# Patient Record
Sex: Male | Born: 1960 | Race: White | Hispanic: No | Marital: Married | Smoking: Never smoker
Health system: Southern US, Community
[De-identification: ages and names within clinical notes are randomized; demographics above are authoritative.]

## PROBLEM LIST (undated history)

## (undated) DIAGNOSIS — J302 Other seasonal allergic rhinitis: Secondary | ICD-10-CM

## (undated) HISTORY — PX: OTHER SURGICAL HISTORY: SHX169

## (undated) HISTORY — PX: EYE SURGERY: SHX253

---

## 1997-01-03 HISTORY — PX: CORNEAL TRANSPLANT: SHX108

## 2001-08-31 ENCOUNTER — Inpatient Hospital Stay (HOSPITAL_COMMUNITY): Admission: EM | Admit: 2001-08-31 | Discharge: 2001-09-02 | Payer: Self-pay | Admitting: Internal Medicine

## 2001-09-01 ENCOUNTER — Encounter: Payer: Self-pay | Admitting: Internal Medicine

## 2005-02-09 ENCOUNTER — Emergency Department (HOSPITAL_COMMUNITY): Admission: EM | Admit: 2005-02-09 | Discharge: 2005-02-10 | Payer: Self-pay | Admitting: Family Medicine

## 2007-07-03 ENCOUNTER — Encounter (INDEPENDENT_AMBULATORY_CARE_PROVIDER_SITE_OTHER): Payer: Self-pay | Admitting: Gastroenterology

## 2007-07-03 ENCOUNTER — Inpatient Hospital Stay (HOSPITAL_COMMUNITY): Admission: EM | Admit: 2007-07-03 | Discharge: 2007-07-04 | Payer: Self-pay | Admitting: Emergency Medicine

## 2007-07-05 ENCOUNTER — Inpatient Hospital Stay (HOSPITAL_COMMUNITY): Admission: EM | Admit: 2007-07-05 | Discharge: 2007-07-11 | Payer: Self-pay | Admitting: Emergency Medicine

## 2007-08-03 ENCOUNTER — Emergency Department (HOSPITAL_COMMUNITY): Admission: EM | Admit: 2007-08-03 | Discharge: 2007-08-03 | Payer: Self-pay | Admitting: Family Medicine

## 2008-03-07 ENCOUNTER — Emergency Department (HOSPITAL_COMMUNITY): Admission: EM | Admit: 2008-03-07 | Discharge: 2008-03-07 | Payer: Self-pay | Admitting: Emergency Medicine

## 2009-04-22 ENCOUNTER — Ambulatory Visit (HOSPITAL_COMMUNITY): Admission: RE | Admit: 2009-04-22 | Discharge: 2009-04-22 | Payer: Self-pay | Admitting: Gastroenterology

## 2009-11-30 ENCOUNTER — Emergency Department (HOSPITAL_COMMUNITY): Admission: EM | Admit: 2009-11-30 | Discharge: 2009-11-30 | Payer: Self-pay | Admitting: Family Medicine

## 2009-11-30 ENCOUNTER — Emergency Department (HOSPITAL_COMMUNITY)
Admission: EM | Admit: 2009-11-30 | Discharge: 2009-11-30 | Payer: Self-pay | Source: Home / Self Care | Admitting: Emergency Medicine

## 2009-12-04 ENCOUNTER — Ambulatory Visit (HOSPITAL_COMMUNITY): Admission: RE | Admit: 2009-12-04 | Discharge: 2009-12-04 | Payer: Self-pay | Admitting: Urology

## 2009-12-04 ENCOUNTER — Ambulatory Visit (HOSPITAL_BASED_OUTPATIENT_CLINIC_OR_DEPARTMENT_OTHER)
Admission: RE | Admit: 2009-12-04 | Discharge: 2009-12-04 | Payer: Self-pay | Source: Home / Self Care | Admitting: Urology

## 2009-12-10 ENCOUNTER — Ambulatory Visit (HOSPITAL_COMMUNITY)
Admission: RE | Admit: 2009-12-10 | Discharge: 2009-12-10 | Payer: Self-pay | Source: Home / Self Care | Attending: Urology | Admitting: Urology

## 2010-01-21 ENCOUNTER — Ambulatory Visit (HOSPITAL_COMMUNITY)
Admission: RE | Admit: 2010-01-21 | Discharge: 2010-01-21 | Payer: Self-pay | Source: Home / Self Care | Attending: Urology | Admitting: Urology

## 2010-03-15 LAB — POCT HEMOGLOBIN-HEMACUE: Hemoglobin: 16.4 g/dL (ref 13.0–17.0)

## 2010-03-16 LAB — URINALYSIS, ROUTINE W REFLEX MICROSCOPIC
Bilirubin Urine: NEGATIVE
Glucose, UA: NEGATIVE mg/dL
Ketones, ur: 15 mg/dL — AB
Leukocytes, UA: NEGATIVE
Nitrite: NEGATIVE
Protein, ur: NEGATIVE mg/dL
Specific Gravity, Urine: 1.014 (ref 1.005–1.030)
Urobilinogen, UA: 0.2 mg/dL (ref 0.0–1.0)
pH: 7 (ref 5.0–8.0)

## 2010-03-16 LAB — URINE MICROSCOPIC-ADD ON

## 2010-05-18 NOTE — Discharge Summary (Signed)
NAME:  Patrick Myers, Patrick Myers                ACCOUNT NO.:  [id number]   MEDICAL RECORD NO.:  [id number]          PATIENT TYPE:  INP   LOCATION:  1538                         FACILITY:  [facility]   PHYSICIAN:  John C. Madilyn Fireman, M.D.    DATE OF BIRTH:  30-Jan-1960   DATE OF ADMISSION:  07/05/2007  DATE OF DISCHARGE:  07/11/2007                               DISCHARGE SUMMARY   HISTORY OF ILLNESS:  The patient is a 50 year old white male who was  readmitted after initial hospitalization on July 03, 2007, with GI  bleeding.  He was found have a small duodenal ulcer at that time with no  significant stigma of hemorrhage at that point.  After a 2-day  admission, he was readmitted the following day with continued dark  stools and increased weakness.  For details, please see admission  history and physical.   COURSE IN THE HOSPITAL:  The patient was readmitted and started back on  IV Protonix.  On 07/06/2007, he underwent EGD with a significant amount  of blood seen in the stomach and an oozing duodenal bulb ulcer.  It was  injected and two clips were placed.  He did fairly well from that point.  He did require I believe 2 units of packed red blood cells during his  admission.  After June 08, 2007, the hemoglobin remained stable in the  range of 9.3 to 10.5, and on discharge it was 9.5.  His diet was  gradually advanced and his BUN was 8 on the day prior to discharge.  He  had, had a positive H. pylori antibody test on his previous admission.  It was decided to wait until his follow-up appointment with Dr. Laural Benes  to treat for H. pylori.  He is discharged on Prilosec OTC 20 mg b.i.d.,  a bland diet and instructions not to take any nonsteroidal anti-  inflammatory drugs.   DISCHARGE DIAGNOSIS:  Duodenal ulcer with hemorrhage.   CONDITION ON DISCHARGE:  Improved.           ______________________________  Everardo All Madilyn Fireman, M.D.     JCH/MEDQ  D:  07/11/2007  T:  07/11/2007  Job:  [id number]   cc:   Danise Edge, M.D.  Fax: [phone]   Anna Genre. Little, M.D.  Fax: [phone]

## 2010-05-18 NOTE — Discharge Summary (Signed)
NAME:  Patrick Myers, Patrick Myers NO.:  [id number]   MEDICAL RECORD NO.:  [id number]          PATIENT TYPE:  INP   LOCATION:  5504                         FACILITY:  [facility]   PHYSICIAN:  Danise Edge, M.D.   DATE OF BIRTH:  [birth year]   DATE OF ADMISSION:  07/03/2007  DATE OF DISCHARGE:  07/04/2007                               DISCHARGE SUMMARY   DISCHARGE DIAGNOSIS:  Resolved upper gastrointestinal bleeding due to an  acute duodenal ulcer.   DISCHARGE MEDICATIONS:  Omeprazole 20 mg daily for 30 days.   LABORATORY DATA:  Pending at discharge.  Gastric biopsy to rule out H.  pylori gastritis.   HOSPITAL COURSE:  [name] is a 50 year old male, born on  1960-10-30.  [name] was hospitalized approximately 25 years ago  in [place] for a bleeding and peptic ulcer.   [name] was admitted to [facility] through the emergency  room on July 03, 2007 to evaluate and treat upper gastrointestinal  bleeding manifested by the passage of dark stool, the evening prior to  admission and maroon stool, the morning of admission associated with  lightheadedness, dyspnea, and faintness.   [name] admission complete metabolic profile was normal.  His  prothrombin time was normal.  His admission hemoglobin was 12 g with a  normal white blood cell count and platelet count.   [name] underwent a diagnostic esophagogastroduodenoscopy at the day  of admission, which revealed a normal hypopharynx, esophagus, and  stomach.  At the junction of the distal duodenal bulb and proximal  second portion of duodenum was a 3-mm x 3-mm duodenal ulcer at low risk  for rebleeding.  Gastric biopsies were performed to rule out H. pylori.   [name] hemoglobin equilibrated at 9.8 g and he has had no further  gastrointestinal bleeding.   [name] is discharged in stable medical condition.  I will heal his  ulcer with omeprazole 20 mg daily for 30 days.  If his  gastric biopsies  are positive for H. pylori, he will receive antibiotic therapy for 2  weeks at the end of his ulcer healing therapy.   I have asked [name] to see me in the office in about 2 weeks.           ______________________________  Danise Edge, M.D.     MJ/MEDQ  D:  07/04/2007  T:  07/04/2007  Job:  [id number]   cc:   Caryn Bee L. Little, M.D.

## 2010-05-18 NOTE — Op Note (Signed)
NAME:  Patrick Myers, Patrick Myers NO.:  [id number]   MEDICAL RECORD NO.:  [id number]          PATIENT TYPE:  INP   LOCATION:  1225                         FACILITY:  [facility]   PHYSICIAN:  Petra Kuba, M.D.    DATE OF BIRTH:  1960-07-31   DATE OF PROCEDURE:  07/06/2007  DATE OF DISCHARGE:                               OPERATIVE REPORT   PROCEDURE:  EGD with therapy.   INDICATIONS:  Upper GI bleeding.  Consent was signed after risks,  benefits, methods, options were thoroughly discussed by both Dr. Danise Edge and me briefly with the wife.   MEDICINES USED:  Versed  5 mg only.   PROCEDURE:  The video endoscope was inserted by direct vision.  There  was a little old blood in his esophagus which could easily be suctioned.  He did have a small hiatal hernia with a widely patent ring, but no  signs of bleeding in the esophagus or obvious Mallory-Weiss tear.  Scope  passed into the stomach.  Lots of old blood was seen.  We advanced to  the antrum.  There seemed to be blood coming retrograde back from the  duodenum.  We advanced into the duodenal bulb, and the ulcer was in the  same position that Dr. Laural Benes had said.  It did have a fresh clot on  it, with some oozing.  Washing the clot away, there was some obvious  oozing from this location.  It was right at the juncture of the bulb and  the C-loop and was actually small and shallow as Dr. Laural Benes had  described.  We went ahead and injected epinephrine multiple times, 1-1.5  mL per injection, until we had good cessation of the bleeding, probably  proceeded with 8 mL at this juncture.  We then went ahead and tried to  clip the area where the bleeding was from.  Two clips were placed, but  we had some difficulty getting perfect position.  There seemed to still  be a residual visible vessel, and we went ahead and injected two more  times at this juncture, and then proceeded with Bi-Cap.  We did the Bi-  Cap at a setting of 220,  which caused some increased oozing.  We  increased it to 225.  Multiple pulses were delivered in the customary  fashion, with good cessation of the bleeding.  There was some oozing at  the edge of the ulcer, probably from the [facility] and not from the usual  site, and we went ahead and injected a little bit of epinephrine into  those sites as well.  The area was then washed and watched for a  prolonged period of time.  There was no further bleeding.  Multiple  pictures were obtained.  The scope periodically was withdrawn back into  the stomach, and water and old blood were suctioned.  At the end of the  procedure, there was nothing left in the stomach.  The stomach was  evaluated on straight and retroflexed visualization.  No additional  lesions or at-risk lesions or signs of bleeding  were seen.  We did  reinsert the scope multiple times before the end of the procedure back  into the duodenal bulb and confirmed good cessation of the bleeding.  The ulcer was washed multiple times at this juncture.  Again, air and  water were suctioned.  On slow withdrawal through the esophagus, no  additional findings were seen.  We did reinsert the scope one more time  into the bulb, which again confirmed good cessation of the bleeding at  the end of the procedure.  Air and water were suctioned.  Scope removed.  The patient tolerated the procedure adequately.  There was no obvious  immediate complication.   ENDOSCOPIC DIAGNOSES:  1. Small hiatal hernia, with a widely patent small fibrous ring.  2. Increased stomach with blood, but all suctioned by the end of the      procedure, and normal evaluation at the end of the procedure,      without any other at-risk lesions.  3. Oozing duodenal ulcer after clot was washed off, with some oozing      even with the clot in place, status post a total of epinephrine      injection to 18 mL, possibly 10-12 injections 3-4 different times      passing the catheter,  status post 2 clips placed, and Bi-Cap done      with the gold probe, 7 mm, first at 220 setting, and then at 225,      with good cessation of bleeding at the end of the procedure.  4. Otherwise normal esophagogastroduodenoscopy, with a good look at      the second and third part of the duodenum as well, not mentioned      above.   PLAN:  See how he does.  Consider reendoscopy versus surgery.  Continue  Protonix drip for now.  Transfuse p.r.n.  Follow labs, etc.  No aspirin  or nonsteroidals long term.  Treat Helicobacter pylori as an outpatient  per Dr.  Laural Benes.           ______________________________  Petra Kuba, M.D.     MEM/MEDQ  D:  07/06/2007  T:  07/06/2007  Job:  [id number]   cc:   Danise Edge, M.D.  Fax: [phone]

## 2010-05-18 NOTE — H&P (Signed)
NAME:  Patrick Myers, Patrick Myers NO.:  [id number]   MEDICAL RECORD NO.:  [id number]          PATIENT TYPE:  INP   LOCATION:  0101                         FACILITY:  [facility]   PHYSICIAN:  Rylend L. Malon Kindle., M.D.DATE OF BIRTH:  August 11, 1960   DATE OF ADMISSION:  07/05/2007  DATE OF DISCHARGE:                              HISTORY & PHYSICAL   REASON FOR ADMISSION:  Upper GI bleed.   HISTORY:  Patient is a nice 50 year old gentleman who was admitted to  the hospital by Dr. Danise Edge on June 30th.  He underwent an  endoscopy at that time for GI bleeding with the passage of dark stools  and marroonish stool. He was __________.  The endoscopy revealed normal  stomach with no evidence of blood in the stomach.  This was biopsied for  H. pylori.  I do not currently have that available.  There were no  varices seen.  This was all noted to be normal in the esophagus and GE  junction.  The duodenal bulb revealed inflammation and a 3-4 mm shallow  ulcer that was exudative with plaque, was seen right at the junction of  the duodenal bulb and distal portion of the duodenum.  It was not  actively bleeding, and visible vessels were not noted.  The patient's  wife brought pictures that Dr. Laural Benes had given them.  It did appear to  be a very shallow ulcer.  The patient continued to have darkish stools,  but it was felt this was equilibration.  He was discharged home  yesterday with an unstable hemoglobin of 9.8.  He notes that yesterday  he felt fine, although he continued to have dark stools, he felt fine  most of the day, then began to have an increasing number of stools.  He  has had a total of five melenic stools since coming out of the hospital  yesterday afternoon.  Called this afternoon and came to the emergency  room.  He vomited up some coffee-ground material in the emergency room,  a small amount.  He does feel better.  An IV was started.  He has been  stable.  He has had fairly  stable vital signs since coming to the  emergency room.   Currently, the patient is on omeprazole and Allegra.   He has no drug allergies.   He adamantly denies taking any nonsteroidals.   MEDICAL HISTORY:  He has a previous history of a bleeding ulcer and was  evaluated for this at some point in the past.  I do not have those  records.  He was admitted to [facility] in August, 2003 with an  acute hepatitis with all workup being negative for A, B, and C, which  __________  what was felt to be a virus, the exact etiology was not  determined.   The only surgeries have been __________.   SOCIAL HISTORY:  He does not smoke or drink.  He is married.  His wife  works __________.   FAMILY HISTORY:  Negative for colon cancer.   PHYSICAL EXAMINATION:  Patient is quite pale.  He is lying in a hospital  bed on the gurney.  VITAL SIGNS:  Afebrile.  Pulse 100, blood pressure 110/60.  There is no blood in his mouth.  NECK:  Supple.  LUNGS:  Clear.  HEART:  Regular rate and rhythm without murmurs or gallops.  ABDOMEN:  Soft and nontender.  RECTAL:  Not performed since he has continued to have melenic stools and  has vomited blood.   ASSESSMENT:  Upper gastrointestinal bleed, probably due to recurrent  bleeding ulcer.   PLAN:  Will admit.  Transfuse 2 units of blood since his hemoglobin has  dropped 2 gm from 9.9 to 7.5.  Will put him on a Protonix drip.  If he  is not stabilized, he may well need another endoscopy in the morning by  Dr. Ewing Schlein.  I have discussed this with the patient and his wife and  explained to them that we will keep him n.p.o. for now.           ______________________________  Llana Aliment. Malon Kindle., M.D.     Waldron Session  D:  07/05/2007  T:  07/05/2007  Job:  [id number]   cc:   Caryn Bee L. Little, M.D.  Fax: [phone]   Danise Edge, M.D.  Fax: [phone]

## 2010-05-18 NOTE — H&P (Signed)
NAME:  Patrick Myers, [place] NO.:  [id number]   MEDICAL RECORD NO.:  [id number]          PATIENT TYPE:  INP   LOCATION:  5504                         FACILITY:  [facility]   PHYSICIAN:  Danise Edge, M.D.   DATE OF BIRTH:  07-30-1960   DATE OF ADMISSION:  07/03/2007  DATE OF DISCHARGE:                              HISTORY & PHYSICAL   ADMISSION DIAGNOSIS:  Gastrointestinal bleeding.   HISTORY:  Mr. Patrick Myers is a 50 year old male born on March 15, 1960.  I admitted [name] through the [facility] Emergency Room to  evaluate and treat gastrointestinal bleeding manifested by the passage  of dark stool last night and maroon stool this morning associated with  lightheadedness, dyspnea, and a fainting sensation.   Approximately 25 years ago, Mr. Patrick Myers was hospitalized while living in  [place] to treat a bleeding peptic ulcer, which was diagnosed by upper  endoscopy.  He did not receive treatment for H. pylori.   Mr. Patrick Myers reports no abdominal pain, nonsteroidal anti-inflammatory  drug use, alcohol use, cigarette smoking, nausea, or vomiting.   MEDICATION ALLERGIES:  None.   CURRENT MEDICATIONS:  Allegra.   PAST MEDICAL HISTORY:  Cornea transplant; bleeding peptic ulcer; [facility] hospitalization, August 2003 to evaluate acute hepatitis which was  negative for hepatitis A, hepatitis B, and hepatitis C.  He completely  recovered from this bout of hepatitis.   FAMILY HISTORY:  Negative for colon cancer.   HABITS:  Mr. Patrick Myers does not smoke cigarettes or consume alcohol.   GENERAL APPEARANCE:  [name] is alert and comfortable, lying on his  stretcher.  HEENT:  Sclerae nonicteric.  Oropharynx normal.  LUNGS:  Clear to auscultation.  CARDIAC:  Regular rhythm without murmurs.  ABDOMEN:  Soft, flat, and nontender.   PROCEDURE:  DIAGNOSTIC ESOPHAGOGASTRODUODENOSCOPY:   After obtaining informed consent, Mr. Patrick Myers was placed in the left  lateral decubitus  position.  I administered 75 mcg of fentanyl and 5 mg  Versed to achieve conscious sedation for the procedure.  The patient's  blood pressure, oxygen saturation, and cardiac rhythm were monitored  throughout the procedure and documented in the medical record.   The Pentax gastroscope was passed through the posterior hypopharynx into  the proximal esophagus without difficulty.  The hypopharynx, larynx, and  vocal cords appeared normal.   ESOPHAGOSCOPY:  The proximal mid and lower segments of the esophageal  mucosa appeared completely normal.  The squamocolumnar junction and  esophagogastric junction were noted at approximately 42 cm from the  incisor teeth.   GASTROSCOPY:  Retroflex view of the gastric cardia and fundus was  normal.  The gastric body, antrum, and pylorus appeared completely  normal.   DUODENOSCOPY:  Examination of the duodenal bulb was normal.  In the  proximal second portion of the duodenum, hidden in a crevice, a 3-mm x 3-  mm ulcer was identified.  The ulcer base is exudative and flat.  The  distal second portion of the duodenum appeared normal.  The estimated  risk for re-bleeding from this lesion would be low.  BIOPSIES:  Biopsies were taken from the gastric antrum, gastric body,  and the gastric cardia to look for H. pylori gastritis.   ASSESSMENT:  Upper gastrointestinal bleeding due to a duodenal ulcer in  the proximal second portion of the duodenum.  Endoscopic appearance of  the ulcer identifies it as a lesion at low risk for re-bleeding.  Biopsies to rule out Helicobacter pylori are pending.   PLAN:  I will place Mr. Patrick Myers on a regular diet.  I will monitor him  overnight for re-bleeding.  I anticipate he should probably be  discharged tomorrow morning.  He will receive proton pump inhibitor  therapy for 4-6 weeks.  He should also receive eradication or antibiotic  therapy for H. pylori.           ______________________________  Danise Edge,  M.D.     MJ/MEDQ  D:  07/03/2007  T:  07/04/2007  Job:  [id number]   cc:   Caryn Bee L. Little, M.D.

## 2010-05-21 NOTE — Discharge Summary (Signed)
NAME:  Patrick Myers, Patrick Myers                          ACCOUNT NO.:  [id number]   MEDICAL RECORD NO.:  [id number]                   PATIENT TYPE:  INP   LOCATION:  0380                                 FACILITY:  [facility]   PHYSICIAN:  Deirdre Peer. Polite, M.D.              DATE OF BIRTH:  [birth year]   DATE OF ADMISSION:  08/31/2001  DATE OF DISCHARGE:  09/02/2001                                 DISCHARGE SUMMARY   DISCHARGE DIAGNOSES:  1. Viral hepatitis.  2. History of peptic ulcer disease status post gastrointestinal bleed in     1987.  3. Status post corneal transplant.   DISCHARGE MEDICATION:  Phenergan 12.5 mg one q.6h. p.r.n. for nausea or  vomiting.   CONSULTATIONS:  None.   PROCEDURES/STUDIES:  None.   LABORATORY DATA:  Admission CMP with albumin of 3.3, AST 1844, ALT 2658.  CBC with white blood cell count of 3.7, rbc 5.25, hemoglobin 15.6,  hematocrit 45.3, platelet count 143.  Alkaline phosphatase 193, bilirubin  1.5.  PT 13, INR 1.  Hepatitis B negative, hepatitis C antibody negative.  Mono screen was negative.  Urine was negative.   Discharge labs with SGOT of 787, SGPT 1748.  PT 12.2, INR 0.8.  CMV, Epstein-  Barr virus and hepatitis A are all pending.   DISPOSITION:  The patient will be discharged to home.   HISTORY OF PRESENT ILLNESS:  This is a 50 year old male who presented to  [facility] on August 31, 2001, with nausea and vomiting, myalgias  and arthralgias with abnormal transaminases per his primary care physician.  The patient has a history of peptic ulcer disease and is status post blood  transfusion in 1982 for large GI bleed.  Four days prior to his admission  the patient had presented to his primary care physician with complaints of  arthralgias and myalgias and was given a prescription for Doxycycline.  The  patient began to have some nausea and vomiting and returned back to his  primary care physician where LFTs were drawn.  Transaminases were  elevated  in the 900 range per Dr. Theresia Lo.  The patient was sent to [facility] for admission.   HOSPITAL COURSE:  ACUTE HEPATITIS:  The patient was admitted with supportive  care of IV fluids, analgesic, and antiemetics.  Hepatitis B, hepatitis C  antibodies were drawn, both of which returned negative.  It was felt that  his hepatitis was viral in nature at which time a hepatitis A, CMV and  Epstein-Barr virus panel were drawn.  Mono screen was negative.  At  discharge his hepatitis A and Epstein-Barr were pending along with CMV.  On  hospitalization day #2, the patient remained afebrile, no nausea, no  vomiting, no abdominal pain and tolerating clear liquid diet without nausea  or vomiting.  His diet was advanced.  On discharge day, he was tolerating a  full liquid diet with no complaints, no nausea or vomiting, no abdominal  pain, having good BMs.  He is afebrile.  Transaminases  have decreased dramatically.  PT 12.2, INR 0.8.  The patient is being  discharged with a follow-up appointment with his primary care physician in  one week.   FOLLOW UP:  The patient will have a one-week appointment with Dr. Clarene Duke.      Stephanie Swaziland, NP                      Deirdre Peer. Polite, M.D.    SJ/MEDQ  D:  09/02/2001  T:  09/04/2001  Job:  [id number]   cc:   Dr. Synetta Fail [facility]

## 2010-09-30 LAB — BASIC METABOLIC PANEL
BUN: 22
BUN: 24 — ABNORMAL HIGH
BUN: 8
CO2: 25
CO2: 28
CO2: 29
Calcium: 7.7 — ABNORMAL LOW
Calcium: 7.9 — ABNORMAL LOW
Calcium: 8 — ABNORMAL LOW
Calcium: 8.2 — ABNORMAL LOW
Calcium: 8.6
Chloride: 106
Creatinine, Ser: 1.01
Creatinine, Ser: 1.03
Creatinine, Ser: 1.06
Creatinine, Ser: 1.11
GFR calc Af Amer: >60
GFR calc Af Amer: >60
GFR calc Af Amer: >60
GFR calc Af Amer: >60
GFR calc non Af Amer: >60
GFR calc non Af Amer: >60
Glucose, Bld: 100 — ABNORMAL HIGH
Glucose, Bld: 135 — ABNORMAL HIGH
Glucose, Bld: 99
Potassium: 3.9
Sodium: 141
Sodium: 141
Sodium: 141

## 2010-09-30 LAB — CROSSMATCH

## 2010-09-30 LAB — COMPREHENSIVE METABOLIC PANEL
AST: 25
CO2: 24
Calcium: 8.9
Creatinine, Ser: 1.08
GFR calc Af Amer: >60
GFR calc non Af Amer: >60

## 2010-09-30 LAB — CBC
HCT: 21.9 — ABNORMAL LOW
HCT: 27.9 — ABNORMAL LOW
Hemoglobin: 7.7 — CL
Hemoglobin: 7.7 — CL
Hemoglobin: 8.3 — ABNORMAL LOW
MCHC: 35.9
MCHC: 35.9
MCHC: 34.8
MCHC: 35
MCHC: 34.9
MCV: 85.6
MCV: 85.7
MCV: 88
Platelets: 202
Platelets: 227
Platelets: 153
Platelets: 166
Platelets: 233
Platelets: 184
RBC: 2.62 — ABNORMAL LOW
RBC: 2.69 — ABNORMAL LOW
RDW: 13.1
RDW: 13.5
RDW: 14.4
RDW: 15.3
RDW: 15.6 — ABNORMAL HIGH
WBC: 14.8 — ABNORMAL HIGH
WBC: 18.8 — ABNORMAL HIGH
WBC: 6.6

## 2010-09-30 LAB — HEMOGLOBIN AND HEMATOCRIT, BLOOD
HCT: 24.9 — ABNORMAL LOW
HCT: 25.8 — ABNORMAL LOW
HCT: 26.2 — ABNORMAL LOW
HCT: 26.4 — ABNORMAL LOW
HCT: 27.1 — ABNORMAL LOW
HCT: 30.3 — ABNORMAL LOW
Hemoglobin: 10.5 — ABNORMAL LOW
Hemoglobin: 7.8 — CL
Hemoglobin: 8.1 — ABNORMAL LOW
Hemoglobin: 8.5 — ABNORMAL LOW
Hemoglobin: 9 — ABNORMAL LOW
Hemoglobin: 9.1 — ABNORMAL LOW
Hemoglobin: 9.4 — ABNORMAL LOW

## 2010-09-30 LAB — DIFFERENTIAL
Basophils Absolute: 0
Basophils Relative: 0
Basophils Relative: 0
Eosinophils Absolute: 0.1
Eosinophils Relative: 1
Eosinophils Relative: 0
Lymphocytes Relative: 28
Lymphocytes Relative: 13
Lymphs Abs: 2.3
Lymphs Abs: 2
Lymphs Abs: 3.8
Monocytes Absolute: 1.2 — ABNORMAL HIGH
Monocytes Relative: 5
Monocytes Relative: 8
Neutro Abs: 12 — ABNORMAL HIGH
Neutro Abs: 13.2 — ABNORMAL HIGH
Neutrophils Relative %: 71
Neutrophils Relative %: 81 — ABNORMAL HIGH

## 2010-09-30 LAB — PREPARE RBC (CROSSMATCH)

## 2010-09-30 LAB — POCT I-STAT, CHEM 8
BUN: 23
Calcium, Ion: 1.21
Chloride: 106
Glucose, Bld: 150 — ABNORMAL HIGH
TCO2: 19

## 2010-09-30 LAB — TYPE AND SCREEN: ABO/RH(D): A POS

## 2010-09-30 LAB — OCCULT BLOOD X 1 CARD TO LAB, STOOL: Fecal Occult Bld: POSITIVE

## 2010-09-30 LAB — PROTIME-INR
INR: 1
INR: 1.1
INR: 1.2
Prothrombin Time: 13.5
Prothrombin Time: 14
Prothrombin Time: 15.4 — ABNORMAL HIGH

## 2010-09-30 LAB — APTT
aPTT: 27
aPTT: 25

## 2010-10-01 LAB — CULTURE, ROUTINE-ABSCESS

## 2011-02-17 ENCOUNTER — Encounter (HOSPITAL_COMMUNITY): Payer: Self-pay | Admitting: Emergency Medicine

## 2011-02-17 ENCOUNTER — Emergency Department (INDEPENDENT_AMBULATORY_CARE_PROVIDER_SITE_OTHER)
Admission: EM | Admit: 2011-02-17 | Discharge: 2011-02-17 | Disposition: A | Discharge status: Home / Self Care | Payer: 59 | Source: Home / Self Care

## 2011-02-17 DIAGNOSIS — J3489 Other specified disorders of nose and nasal sinuses: Secondary | ICD-10-CM

## 2011-02-17 DIAGNOSIS — J340 Abscess, furuncle and carbuncle of nose: Secondary | ICD-10-CM

## 2011-02-17 MED ORDER — DOXYCYCLINE HYCLATE 100 MG PO CAPS: 100.0000 mg | ORAL_CAPSULE | Freq: Two times a day (BID) | ORAL | Status: AC

## 2011-02-17 NOTE — Discharge Instructions (Signed)
Continue Ibuprofen as needed for discomfort. You can take 600- 800 mg every 8 hrs as needed.  You may also take Tylenol in addition if needed. Take 650mg  every 4 hrs, or 1000 mg every 6hrs, but do not take more than 4000 mg per 24 hrs.  Warm compresses to nose. Return in 2 days for recheck, sooner if worsening or you develop a fever.

## 2011-02-17 NOTE — ED Notes (Signed)
PT HERE WITH POSS MRSA OUTBREAK TO NOSE WITH CELLULITIS,AND CONSTANT THROB PAIN RADIATING TO HEAD DOWN TO SHOULDERS.NOSE SORE TO TOUCH.PT HAS HX MRSA AND STATES THIS IS HOW SX FEEL BUT I THINK THE SORE IS IN MY NOSTRIL.NO FEVERS.PT TRIED HOT COMPRESS,IBUPROFEN BUT NO RELIEF.SX STARTED 02/14/11

## 2011-02-17 NOTE — ED Provider Notes (Signed)
History     CSN: [id number]  Arrival date & time 02/17/11  1052   None     Chief Complaint  Patient presents with  . Recurrent Skin Infections    (Consider location/radiation/quality/duration/timing/severity/associated sxs/prior treatment) HPI Comments: Patient presents with complaints of painful redness and swelling of the tip of his nose. It is also painful and swollen inside of his nose. He has not noticed any drainage. This began 3 days ago and is progressively worsening. He has a history of MRSA, and states that this feels the same. He denies injury or recent URI symptoms.    History reviewed. No pertinent past medical history.  Past Surgical History  Procedure Date  . Corneal transplant 1999    History reviewed. No pertinent family history.  History  Substance Use Topics  . Smoking status: Never Smoker   . Smokeless tobacco: Not on file  . Alcohol Use: No      Review of Systems  Constitutional: Negative for fever, chills and fatigue.  HENT: Negative for ear pain, congestion, sore throat, rhinorrhea and sinus pressure.   Respiratory: Negative for cough.   Cardiovascular: Negative for chest pain and palpitations.  Skin: Negative for wound.    Allergies  Review of patient's allergies indicates no known allergies.  Home Medications   Current Outpatient Rx  Name Route Sig Dispense Refill  . DOXYCYCLINE HYCLATE 100 MG PO CAPS Oral Take 1 capsule (100 mg total) by mouth 2 (two) times daily. 20 capsule 0    BP 162/80  Pulse 82  Temp(Src) 98.2 F (36.8 C) (Oral)  Resp 16  SpO2 97%  Physical Exam  Nursing note and vitals reviewed. Constitutional: He appears well-developed and well-nourished. No distress.  HENT:  Head: Normocephalic and atraumatic.  Right Ear: Tympanic membrane, external ear and ear canal normal.  Left Ear: Tympanic membrane, external ear and ear canal normal.  Nose: Sinus tenderness present. No mucosal edema, rhinorrhea, nasal  deformity, septal deviation or nasal septal hematoma. No epistaxis.  No foreign bodies.    Mouth/Throat: Uvula is midline, oropharynx is clear and moist and mucous membranes are normal. No oropharyngeal exudate, posterior oropharyngeal edema or posterior oropharyngeal erythema.  Neck: Neck supple.  Cardiovascular: Normal rate, regular rhythm and normal heart sounds.   Pulmonary/Chest: Effort normal and breath sounds normal. No respiratory distress.  Lymphadenopathy:    He has no cervical adenopathy.  Neurological: He is alert.  Skin: Skin is warm and dry.  Psychiatric: He has a normal mood and affect.    ED Course  Procedures (including critical care time)  Labs Reviewed - No data to display No results found.   1. Cellulitis of nose       MDM  Pt declined rx pain medication, instead choosing to continue Ibuprofen.         Melody Comas, [place] 02/17/11 1155

## 2011-02-19 NOTE — ED Provider Notes (Signed)
Medical screening examination/treatment/procedure(s) were performed by non-physician practitioner and as supervising physician I was immediately available for consultation/collaboration.  Corrie Mckusick, MD 02/19/11 2014

## 2011-10-25 ENCOUNTER — Encounter (HOSPITAL_COMMUNITY): Payer: Self-pay | Admitting: *Deleted

## 2011-10-25 ENCOUNTER — Emergency Department (HOSPITAL_COMMUNITY)
Admission: EM | Admit: 2011-10-25 | Discharge: 2011-10-25 | Disposition: A | Discharge status: Home / Self Care | Payer: 59 | Attending: Emergency Medicine | Admitting: Emergency Medicine

## 2011-10-25 ENCOUNTER — Other Ambulatory Visit: Payer: Self-pay

## 2011-10-25 DIAGNOSIS — IMO0001 Reserved for inherently not codable concepts without codable children: Secondary | ICD-10-CM

## 2011-10-25 DIAGNOSIS — R42 Dizziness and giddiness: Secondary | ICD-10-CM

## 2011-10-25 DIAGNOSIS — M2550 Pain in unspecified joint: Secondary | ICD-10-CM | POA: Insufficient documentation

## 2011-10-25 DIAGNOSIS — H9319 Tinnitus, unspecified ear: Secondary | ICD-10-CM | POA: Insufficient documentation

## 2011-10-25 DIAGNOSIS — R030 Elevated blood-pressure reading, without diagnosis of hypertension: Secondary | ICD-10-CM | POA: Insufficient documentation

## 2011-10-25 HISTORY — DX: Other seasonal allergic rhinitis: J30.2

## 2011-10-25 LAB — BASIC METABOLIC PANEL
Chloride: 100 mEq/L (ref 96–112)
GFR calc Af Amer: 77 mL/min — ABNORMAL LOW (ref >90)
GFR calc non Af Amer: 67 mL/min — ABNORMAL LOW (ref >90)
Glucose, Bld: 100 mg/dL — ABNORMAL HIGH (ref 70–99)
Potassium: 4.2 mEq/L (ref 3.5–5.1)
Sodium: 135 mEq/L (ref 135–145)

## 2011-10-25 LAB — CBC WITH DIFFERENTIAL/PLATELET
HCT: 43.4 % (ref 39.0–52.0)
Hemoglobin: 16 g/dL (ref 13.0–17.0)
Lymphs Abs: 1.9 10*3/uL (ref 0.7–4.0)
MCH: 30.5 pg (ref 26.0–34.0)
Monocytes Absolute: 0.6 10*3/uL (ref 0.1–1.0)
Monocytes Relative: 9 % (ref 3–12)
Neutro Abs: 4.3 10*3/uL (ref 1.7–7.7)
Neutrophils Relative %: 60 % (ref 43–77)
RBC: 5.25 MIL/uL (ref 4.22–5.81)

## 2011-10-25 LAB — POCT I-STAT TROPONIN I: Troponin i, poc: 0.01 ng/mL (ref 0.00–0.08)

## 2011-10-25 MED ORDER — MECLIZINE HCL 25 MG PO TABS
25.0000 mg | ORAL_TABLET | Freq: Once | ORAL | Status: AC
  Administered 2011-10-25: 25 mg via ORAL
  Filled 2011-10-25: qty 1

## 2011-10-25 MED ORDER — MECLIZINE HCL 12.5 MG PO TABS: 25.0000 mg | ORAL_TABLET | Freq: Three times a day (TID) | ORAL | Status: DC | PRN

## 2011-10-25 NOTE — ED Provider Notes (Addendum)
History     CSN: [id number]  Arrival date & time 10/25/11  1033   First MD Initiated Contact with Patient 10/25/11 1130      No chief complaint on file.   (Consider location/radiation/quality/duration/timing/severity/associated sxs/prior treatment) HPI Comments: Patrick Myers presents for evaluation of lightheadedness and dizziness.  He reports he has had several minor episodes of dizziness over the last 2 weeks.  Each episode self resolved.  He reports today while at work he experienced sudden onset lightheadedness and dizziness.  He reports feeling a diffuse weakness over his body and a mild sensation as if the environment was moving.  He denies any chest pain, palpitations, shortness of breath, fever, difficulty speaking or swallowing, or unilateral weakness.  He also denies headache.  He reports ringing in his ears but states he has chronic tinnitus.  He has had no vomiting.  The history is provided by the patient. No language interpreter was used.    Past Medical History  Diagnosis Date  . Seasonal allergies     Past Surgical History  Procedure Date  . Corneal transplant 1999  . Eye surgery     corneal transplant  . Lithrotripsy     No family history on file.  History  Substance Use Topics  . Smoking status: Never Smoker   . Smokeless tobacco: Not on file  . Alcohol Use: No      Review of Systems  Constitutional: Negative for fever, chills, diaphoresis, activity change, appetite change and fatigue.  HENT: Positive for tinnitus. Negative for hearing loss, ear pain, congestion, sore throat, facial swelling, rhinorrhea, drooling, trouble swallowing, neck pain, neck stiffness and voice change.   Eyes: Negative for photophobia and visual disturbance.  Respiratory: Negative for apnea, cough, chest tightness and shortness of breath.   Cardiovascular: Negative for chest pain, palpitations and leg swelling.  Gastrointestinal: Negative for nausea, vomiting, diarrhea and  abdominal distention.  Musculoskeletal: Positive for arthralgias. Negative for myalgias, back pain, joint swelling and gait problem.  Skin: Negative for rash.  Neurological: Positive for dizziness and light-headedness. Negative for tremors, seizures, syncope, facial asymmetry, speech difficulty, weakness, numbness and headaches.  Hematological: Negative.   Psychiatric/Behavioral: Negative.     Allergies  Review of patient's allergies indicates no known allergies.  Home Medications   Current Outpatient Rx  Name Route Sig Dispense Refill  . ACETAMINOPHEN 500 MG PO TABS Oral Take 1,000 mg by mouth every 6 (six) hours as needed. For pain    . BUPROPION HCL ER (XL) 150 MG PO TB24 Oral Take 300 mg by mouth daily.    Marland Kitchen CETIRIZINE HCL 10 MG PO TABS Oral Take 10 mg by mouth 2 (two) times daily as needed. Takes 1 tablet every morning and then again later in the day if needed for allergies.    . IBUPROFEN 200 MG PO TABS Oral Take 400 mg by mouth every 6 (six) hours as needed. For pain    . ADULT MULTIVITAMIN W/MINERALS CH Oral Take 1 tablet by mouth daily.      BP 146/89  Pulse 78  Temp 97.7 F (36.5 C) (Oral)  Resp 15  SpO2 99%  Physical Exam  Nursing note and vitals reviewed. Constitutional: He is oriented to person, place, and time. He appears well-developed and well-nourished. No distress.  HENT:  Head: Normocephalic and atraumatic.  Right Ear: External ear normal.  Left Ear: External ear normal.  Nose: Nose normal.  Mouth/Throat: Oropharynx is clear and moist. No oropharyngeal exudate.  Eyes: Conjunctivae normal and EOM are normal. Pupils are equal, round, and reactive to light. Right eye exhibits no discharge. Left eye exhibits no discharge. No scleral icterus.       Note horizontal nystagmus  Neck: Normal range of motion. Neck supple. No JVD present. No tracheal deviation present.  Cardiovascular: Normal rate, regular rhythm, normal heart sounds and intact distal pulses.  Exam  reveals no gallop and no friction rub.   No murmur heard. Pulmonary/Chest: Effort normal and breath sounds normal. No stridor. No respiratory distress. He has no wheezes. He has no rales. He exhibits no tenderness.  Abdominal: Soft. Bowel sounds are normal. He exhibits no distension and no mass. There is no tenderness. There is no rebound and no guarding.  Musculoskeletal: Normal range of motion. He exhibits no edema and no tenderness.  Lymphadenopathy:    He has no cervical adenopathy.  Neurological: He is alert and oriented to person, place, and time. He has normal strength. He displays no atrophy and no tremor. No cranial nerve deficit or sensory deficit. He exhibits normal muscle tone. He displays a negative Romberg sign. He displays no seizure activity. Coordination normal.       Nl finger to nose bilat.  No pronator drift.  Skin: Skin is warm and dry. No rash noted. He is not diaphoretic. No erythema. No pallor.  Psychiatric: He has a normal mood and affect. His behavior is normal.    ED Course  Procedures (including critical care time)  Labs Reviewed  BASIC METABOLIC PANEL - Abnormal; Notable for the following:    Glucose, Bld 100 (*)     GFR calc non Af Amer 67 (*)     GFR calc Af Amer 77 (*)     All other components within normal limits  CBC WITH DIFFERENTIAL - Abnormal; Notable for the following:    MCHC 36.9 (*)     All other components within normal limits  GLUCOSE, CAPILLARY - Abnormal; Notable for the following:    Glucose-Capillary 102 (*)     All other components within normal limits  POCT I-STAT TROPONIN I   No results found.   No diagnosis found.    MDM  Pt presents for evaluation of dizziness.  He has no focal neurologic deficits.  His symptoms were exacerbated by closing his eyes and leaning his head back.  The dizziness resolved after administering meclizine.  His exam and history appear consistent with peripheral vertigo.  Plan symptomatic care and outpt  follow-up.  Note elevated BP.  He has no clinical evidence of end organ dysfunction and has discussed his blood pressure on several occasions with his PMD.  He has a follow-up appointment tomorrow.        Tobin Chad, MD 10/25/11 1610  Tobin Chad, MD 10/25/11 9604

## 2011-10-25 NOTE — ED Notes (Signed)
Pt started feeling lightheaded and hot and associated with overall weakness over the last week and is having a numbness in his left arm.  Pt reports he has a pinched nerve and the pain in his left arm feels like that same pain.  No chest pain, but reports some sob

## 2012-02-18 ENCOUNTER — Other Ambulatory Visit: Payer: Self-pay

## 2012-10-17 DIAGNOSIS — H18519 Endothelial corneal dystrophy, unspecified eye: Secondary | ICD-10-CM | POA: Insufficient documentation

## 2012-11-08 ENCOUNTER — Other Ambulatory Visit: Payer: Self-pay

## 2012-12-18 DIAGNOSIS — H269 Unspecified cataract: Secondary | ICD-10-CM | POA: Insufficient documentation

## 2012-12-18 DIAGNOSIS — J302 Other seasonal allergic rhinitis: Secondary | ICD-10-CM | POA: Insufficient documentation

## 2012-12-18 DIAGNOSIS — H9190 Unspecified hearing loss, unspecified ear: Secondary | ICD-10-CM | POA: Insufficient documentation

## 2012-12-18 DIAGNOSIS — F32A Depression, unspecified: Secondary | ICD-10-CM | POA: Insufficient documentation

## 2012-12-18 DIAGNOSIS — A4902 Methicillin resistant Staphylococcus aureus infection, unspecified site: Secondary | ICD-10-CM | POA: Insufficient documentation

## 2012-12-18 DIAGNOSIS — Z8711 Personal history of peptic ulcer disease: Secondary | ICD-10-CM | POA: Insufficient documentation

## 2014-11-19 DIAGNOSIS — H2512 Age-related nuclear cataract, left eye: Secondary | ICD-10-CM | POA: Insufficient documentation

## 2015-01-23 MED FILL — MIRTAZAPINE 30 MG TABLET: 30 | 30 days supply | Qty: 30 | Fill #2

## 2015-02-09 MED FILL — CLORAZEPATE 7.5 MG TABLET: 7.5 | 30 days supply | Qty: 90 | Fill #1

## 2015-02-23 MED FILL — LOSARTAN POTASSIUM 50 MG TA: 50 | 60 days supply | Qty: 60 | Fill #2

## 2015-03-02 DIAGNOSIS — H1851 Endothelial corneal dystrophy: Secondary | ICD-10-CM | POA: Diagnosis not present

## 2015-03-02 DIAGNOSIS — H2512 Age-related nuclear cataract, left eye: Secondary | ICD-10-CM | POA: Diagnosis not present

## 2015-03-05 DIAGNOSIS — F3341 Major depressive disorder, recurrent, in partial remission: Secondary | ICD-10-CM | POA: Diagnosis not present

## 2015-03-05 MED FILL — MIRTAZAPINE 30 MG TABLET: 30 | 30 days supply | Qty: 30 | Fill #0

## 2015-03-05 MED FILL — PREDNISOLONE AC 1% EYE DROP: 1 | 90 days supply | Qty: 5 | Fill #0

## 2015-03-09 MED FILL — CLORAZEPATE 7.5 MG TABLET: 7.5 | 30 days supply | Qty: 60 | Fill #0

## 2015-03-25 DIAGNOSIS — I10 Essential (primary) hypertension: Secondary | ICD-10-CM | POA: Diagnosis not present

## 2015-03-25 DIAGNOSIS — F338 Other recurrent depressive disorders: Secondary | ICD-10-CM | POA: Diagnosis not present

## 2015-03-25 DIAGNOSIS — J301 Allergic rhinitis due to pollen: Secondary | ICD-10-CM | POA: Diagnosis not present

## 2015-03-25 DIAGNOSIS — Z0000 Encounter for general adult medical examination without abnormal findings: Secondary | ICD-10-CM | POA: Diagnosis not present

## 2015-03-25 DIAGNOSIS — D126 Benign neoplasm of colon, unspecified: Secondary | ICD-10-CM | POA: Diagnosis not present

## 2015-03-25 DIAGNOSIS — N200 Calculus of kidney: Secondary | ICD-10-CM | POA: Diagnosis not present

## 2015-03-25 DIAGNOSIS — E786 Lipoprotein deficiency: Secondary | ICD-10-CM | POA: Diagnosis not present

## 2015-03-25 DIAGNOSIS — Z125 Encounter for screening for malignant neoplasm of prostate: Secondary | ICD-10-CM | POA: Diagnosis not present

## 2015-03-30 MED FILL — BUPROPION HCL XL 150 MG TAB: 150 | 90 days supply | Qty: 270 | Fill #0

## 2015-04-15 MED FILL — MIRTAZAPINE 30 MG TABLET: 30 | 30 days supply | Qty: 30 | Fill #1

## 2015-04-27 MED FILL — LOSARTAN POTASSIUM 50 MG TA: 50 | 30 days supply | Qty: 30 | Fill #0

## 2015-04-27 MED FILL — CLORAZEPATE 7.5 MG TABLET: 7.5 | 30 days supply | Qty: 60 | Fill #1

## 2015-04-29 MED FILL — GAVILYTE-N SOLUTION: 420 | 1 days supply | Qty: 4000 | Fill #0

## 2015-05-08 ENCOUNTER — Other Ambulatory Visit: Payer: Self-pay | Admitting: Gastroenterology

## 2015-05-08 DIAGNOSIS — Z86010 Personal history of colonic polyps: Secondary | ICD-10-CM | POA: Diagnosis not present

## 2015-05-08 DIAGNOSIS — D124 Benign neoplasm of descending colon: Secondary | ICD-10-CM | POA: Diagnosis not present

## 2015-05-08 DIAGNOSIS — D123 Benign neoplasm of transverse colon: Secondary | ICD-10-CM | POA: Diagnosis not present

## 2015-05-22 MED FILL — LOSARTAN POTASSIUM 50 MG TA: 50 | 90 days supply | Qty: 90 | Fill #1

## 2015-05-22 MED FILL — MIRTAZAPINE 30 MG TABLET: 30 | 30 days supply | Qty: 30 | Fill #2

## 2015-05-25 MED FILL — CLORAZEPATE 7.5 MG TABLET: 7.5 | 30 days supply | Qty: 60 | Fill #2

## 2015-06-29 MED FILL — CLORAZEPATE 7.5 MG TABLET: 7.5 | 30 days supply | Qty: 60 | Fill #3

## 2015-06-29 MED FILL — BUPROPION HCL XL 150 MG TAB: 150 | 90 days supply | Qty: 270 | Fill #1

## 2015-06-29 MED FILL — MIRTAZAPINE 30 MG TABLET: 30 | 30 days supply | Qty: 30 | Fill #0

## 2015-07-08 DIAGNOSIS — F3341 Major depressive disorder, recurrent, in partial remission: Secondary | ICD-10-CM | POA: Diagnosis not present

## 2015-07-31 MED FILL — MIRTAZAPINE 30 MG TABLET: 30 | 30 days supply | Qty: 30 | Fill #1

## 2015-07-31 MED FILL — CLORAZEPATE 7.5 MG TABLET: 7.5 | 30 days supply | Qty: 60 | Fill #4

## 2015-08-25 MED FILL — LOSARTAN POTASSIUM 50 MG TA: 50 | 90 days supply | Qty: 90 | Fill #0

## 2015-08-31 DIAGNOSIS — Z961 Presence of intraocular lens: Secondary | ICD-10-CM | POA: Diagnosis not present

## 2015-08-31 DIAGNOSIS — H1851 Endothelial corneal dystrophy: Secondary | ICD-10-CM | POA: Diagnosis not present

## 2015-08-31 DIAGNOSIS — H2512 Age-related nuclear cataract, left eye: Secondary | ICD-10-CM | POA: Diagnosis not present

## 2015-08-31 DIAGNOSIS — Z947 Corneal transplant status: Secondary | ICD-10-CM | POA: Diagnosis not present

## 2015-08-31 MED FILL — PREDNISOLONE AC 1% EYE DROP: 1 | 75 days supply | Qty: 5 | Fill #0

## 2015-09-15 MED FILL — CLORAZEPATE 7.5 MG TABLET: 7.5 | 30 days supply | Qty: 60 | Fill #0

## 2015-09-23 MED FILL — MIRTAZAPINE 30 MG TABLET: 30 | 30 days supply | Qty: 30 | Fill #2

## 2015-10-06 MED FILL — BUPROPION HCL XL 150 MG TAB: 150 | 90 days supply | Qty: 270 | Fill #2

## 2015-10-15 MED FILL — CLORAZEPATE 7.5 MG TABLET: 7.5 | 30 days supply | Qty: 60 | Fill #1

## 2015-11-09 MED FILL — MIRTAZAPINE 30 MG TABLET: 30 | 30 days supply | Qty: 30 | Fill #0

## 2015-11-12 MED FILL — CHLORHEXIDINE 0.12% RINSE: 0.12 | 14 days supply | Qty: 473 | Fill #0

## 2015-11-17 MED FILL — CLORAZEPATE 7.5 MG TABLET: 7.5 | 30 days supply | Qty: 60 | Fill #2

## 2015-11-19 DIAGNOSIS — F3341 Major depressive disorder, recurrent, in partial remission: Secondary | ICD-10-CM | POA: Diagnosis not present

## 2015-11-24 MED FILL — LOSARTAN POTASSIUM 50 MG TA: 50 | 90 days supply | Qty: 90 | Fill #1

## 2015-12-14 MED FILL — MIRTAZAPINE 30 MG TABLET: 30 | 30 days supply | Qty: 30 | Fill #1

## 2015-12-15 MED FILL — CLORAZEPATE 7.5 MG TABLET: 7.5 | 30 days supply | Qty: 60 | Fill #3

## 2015-12-30 DIAGNOSIS — F3341 Major depressive disorder, recurrent, in partial remission: Secondary | ICD-10-CM | POA: Diagnosis not present

## 2015-12-30 MED FILL — BUPROPION HCL XL 150 MG TAB: 150 | 30 days supply | Qty: 90 | Fill #0

## 2016-01-11 MED FILL — CLORAZEPATE 7.5 MG TABLET: 7.5 | 30 days supply | Qty: 90 | Fill #0

## 2016-01-11 MED FILL — MIRTAZAPINE 30 MG TABLET: 30 | 30 days supply | Qty: 30 | Fill #2

## 2016-02-04 MED FILL — BUPROPION HCL XL 150 MG TAB: 150 | 30 days supply | Qty: 90 | Fill #1

## 2016-02-08 MED FILL — PREDNISOLONE AC 1% EYE DROP: 1 | 75 days supply | Qty: 5 | Fill #1

## 2016-02-24 MED FILL — LOSARTAN POTASSIUM 50 MG TA: 50 | 90 days supply | Qty: 90 | Fill #2

## 2016-03-07 MED FILL — CLORAZEPATE 7.5 MG TABLET: 7.5 | 30 days supply | Qty: 90 | Fill #1

## 2016-03-07 MED FILL — MIRTAZAPINE 30 MG TABLET: 30 | 30 days supply | Qty: 30 | Fill #3

## 2016-03-08 MED FILL — NAPROXEN SODIUM 550 MG TAB: 550 | 6 days supply | Qty: 12 | Fill #0

## 2016-03-16 MED FILL — CHLORHEXIDINE 0.12% RINSE: 0.12 | 17 days supply | Qty: 473 | Fill #0

## 2016-04-13 DIAGNOSIS — Z961 Presence of intraocular lens: Secondary | ICD-10-CM | POA: Diagnosis not present

## 2016-04-13 DIAGNOSIS — H2512 Age-related nuclear cataract, left eye: Secondary | ICD-10-CM | POA: Diagnosis not present

## 2016-04-13 DIAGNOSIS — H1851 Endothelial corneal dystrophy: Secondary | ICD-10-CM | POA: Diagnosis not present

## 2016-04-13 DIAGNOSIS — Z947 Corneal transplant status: Secondary | ICD-10-CM | POA: Diagnosis not present

## 2016-04-15 DIAGNOSIS — F338 Other recurrent depressive disorders: Secondary | ICD-10-CM | POA: Diagnosis not present

## 2016-04-15 DIAGNOSIS — I10 Essential (primary) hypertension: Secondary | ICD-10-CM | POA: Diagnosis not present

## 2016-04-15 DIAGNOSIS — R809 Proteinuria, unspecified: Secondary | ICD-10-CM | POA: Diagnosis not present

## 2016-04-15 DIAGNOSIS — Z0000 Encounter for general adult medical examination without abnormal findings: Secondary | ICD-10-CM | POA: Diagnosis not present

## 2016-04-15 DIAGNOSIS — E786 Lipoprotein deficiency: Secondary | ICD-10-CM | POA: Diagnosis not present

## 2016-04-15 DIAGNOSIS — Z87442 Personal history of urinary calculi: Secondary | ICD-10-CM | POA: Diagnosis not present

## 2016-04-15 DIAGNOSIS — J301 Allergic rhinitis due to pollen: Secondary | ICD-10-CM | POA: Diagnosis not present

## 2016-04-15 DIAGNOSIS — Z86010 Personal history of colonic polyps: Secondary | ICD-10-CM | POA: Diagnosis not present

## 2016-04-15 DIAGNOSIS — N189 Chronic kidney disease, unspecified: Secondary | ICD-10-CM | POA: Diagnosis not present

## 2016-04-18 DIAGNOSIS — R809 Proteinuria, unspecified: Secondary | ICD-10-CM | POA: Diagnosis not present

## 2016-05-04 MED FILL — AMLODIPINE BESYLATE 2.5 MG: 2.5 | 90 days supply | Qty: 90 | Fill #0

## 2016-05-05 MED FILL — LOSARTAN POTASSIUM 100 MG T: 100 | 90 days supply | Qty: 90 | Fill #0

## 2016-05-09 MED FILL — CLORAZEPATE 7.5 MG TABLET: 7.5 | 30 days supply | Qty: 90 | Fill #2

## 2016-06-13 MED FILL — CLORAZEPATE 7.5 MG TABLET: 7.5 | 30 days supply | Qty: 90 | Fill #3

## 2016-06-15 DIAGNOSIS — H903 Sensorineural hearing loss, bilateral: Secondary | ICD-10-CM | POA: Diagnosis not present

## 2016-07-13 DIAGNOSIS — I10 Essential (primary) hypertension: Secondary | ICD-10-CM | POA: Diagnosis not present

## 2016-07-13 MED FILL — HYDROCHLOROTHIAZIDE 12.5 MG: 12.5 | 30 days supply | Qty: 30 | Fill #0

## 2016-07-18 MED FILL — PREDNISOLONE AC 1% EYE DROP: 1 | 75 days supply | Qty: 5 | Fill #2

## 2016-07-20 DIAGNOSIS — F3341 Major depressive disorder, recurrent, in partial remission: Secondary | ICD-10-CM | POA: Diagnosis not present

## 2016-08-04 DIAGNOSIS — I10 Essential (primary) hypertension: Secondary | ICD-10-CM | POA: Diagnosis not present

## 2016-08-19 DIAGNOSIS — F3341 Major depressive disorder, recurrent, in partial remission: Secondary | ICD-10-CM | POA: Diagnosis not present

## 2016-08-29 MED FILL — LOSARTAN POTASSIUM 100 MG T: 100 | 90 days supply | Qty: 90 | Fill #1

## 2016-09-21 DIAGNOSIS — Z961 Presence of intraocular lens: Secondary | ICD-10-CM | POA: Diagnosis not present

## 2016-09-21 DIAGNOSIS — H2512 Age-related nuclear cataract, left eye: Secondary | ICD-10-CM | POA: Diagnosis not present

## 2016-09-21 DIAGNOSIS — H1851 Endothelial corneal dystrophy: Secondary | ICD-10-CM | POA: Diagnosis not present

## 2016-09-21 DIAGNOSIS — Z947 Corneal transplant status: Secondary | ICD-10-CM | POA: Diagnosis not present

## 2016-09-23 MED FILL — CLORAZEPATE 7.5 MG TABLET: 7.5 | 30 days supply | Qty: 90 | Fill #0

## 2016-09-29 DIAGNOSIS — I10 Essential (primary) hypertension: Secondary | ICD-10-CM | POA: Diagnosis not present

## 2016-09-29 DIAGNOSIS — R809 Proteinuria, unspecified: Secondary | ICD-10-CM | POA: Diagnosis not present

## 2016-09-29 DIAGNOSIS — N2581 Secondary hyperparathyroidism of renal origin: Secondary | ICD-10-CM | POA: Diagnosis not present

## 2016-09-29 DIAGNOSIS — N183 Chronic kidney disease, stage 3 (moderate): Secondary | ICD-10-CM | POA: Diagnosis not present

## 2016-10-04 ENCOUNTER — Other Ambulatory Visit (HOSPITAL_COMMUNITY): Payer: Self-pay | Admitting: Nephrology

## 2016-10-04 ENCOUNTER — Other Ambulatory Visit: Payer: Self-pay | Admitting: Nephrology

## 2016-10-04 DIAGNOSIS — N183 Chronic kidney disease, stage 3 unspecified: Secondary | ICD-10-CM

## 2016-10-10 MED FILL — SERTRALINE HCL 100 MG TAB: 100 | 30 days supply | Qty: 30 | Fill #0

## 2016-10-11 ENCOUNTER — Ambulatory Visit (HOSPITAL_COMMUNITY)
Admission: RE | Admit: 2016-10-11 | Discharge: 2016-10-11 | Disposition: A | Discharge status: Home / Self Care | Payer: 59 | Source: Ambulatory Visit | Attending: Nephrology | Admitting: Nephrology

## 2016-10-11 DIAGNOSIS — N400 Enlarged prostate without lower urinary tract symptoms: Secondary | ICD-10-CM | POA: Insufficient documentation

## 2016-10-11 DIAGNOSIS — N183 Chronic kidney disease, stage 3 unspecified: Secondary | ICD-10-CM

## 2016-11-07 MED FILL — CLORAZEPATE 7.5 MG TABLET: 7.5 | 30 days supply | Qty: 90 | Fill #1

## 2016-11-07 MED FILL — SERTRALINE HCL 100 MG TAB: 100 | 30 days supply | Qty: 30 | Fill #1

## 2016-11-28 MED FILL — LOSARTAN POTASSIUM 100 MG T: 100 | 90 days supply | Qty: 90 | Fill #2

## 2016-12-07 DIAGNOSIS — F3341 Major depressive disorder, recurrent, in partial remission: Secondary | ICD-10-CM | POA: Diagnosis not present

## 2016-12-07 MED FILL — SERTRALINE HCL 100 MG TAB: 100 | 90 days supply | Qty: 135 | Fill #0

## 2016-12-28 MED FILL — CLORAZEPATE 7.5 MG TABLET: 7.5 | 30 days supply | Qty: 90 | Fill #2

## 2017-01-09 DIAGNOSIS — N183 Chronic kidney disease, stage 3 (moderate): Secondary | ICD-10-CM | POA: Diagnosis not present

## 2017-01-10 MED FILL — PREDNISOLONE AC 1% EYE DROP: 1 | 90 days supply | Qty: 5 | Fill #0

## 2017-01-16 DIAGNOSIS — F3341 Major depressive disorder, recurrent, in partial remission: Secondary | ICD-10-CM | POA: Diagnosis not present

## 2017-02-02 DIAGNOSIS — R809 Proteinuria, unspecified: Secondary | ICD-10-CM | POA: Diagnosis not present

## 2017-02-02 DIAGNOSIS — N2581 Secondary hyperparathyroidism of renal origin: Secondary | ICD-10-CM | POA: Diagnosis not present

## 2017-02-02 DIAGNOSIS — I129 Hypertensive chronic kidney disease with stage 1 through stage 4 chronic kidney disease, or unspecified chronic kidney disease: Secondary | ICD-10-CM | POA: Diagnosis not present

## 2017-02-02 DIAGNOSIS — N183 Chronic kidney disease, stage 3 (moderate): Secondary | ICD-10-CM | POA: Diagnosis not present

## 2017-02-06 MED FILL — CLORAZEPATE 7.5 MG TABLET: 7.5 | 30 days supply | Qty: 90 | Fill #3

## 2017-02-15 DIAGNOSIS — F3341 Major depressive disorder, recurrent, in partial remission: Secondary | ICD-10-CM | POA: Diagnosis not present

## 2017-02-27 MED FILL — LOSARTAN POTASSIUM 100 MG T: 100 | 90 days supply | Qty: 90 | Fill #3

## 2017-03-28 MED FILL — CLORAZEPATE 7.5 MG TABLET: 7.5 | 30 days supply | Qty: 90 | Fill #0

## 2017-04-17 MED FILL — SERTRALINE HCL 100 MG TAB: 100 | 90 days supply | Qty: 135 | Fill #1

## 2017-05-17 DIAGNOSIS — H2512 Age-related nuclear cataract, left eye: Secondary | ICD-10-CM | POA: Diagnosis not present

## 2017-05-17 DIAGNOSIS — H1851 Endothelial corneal dystrophy: Secondary | ICD-10-CM | POA: Diagnosis not present

## 2017-05-17 DIAGNOSIS — Z947 Corneal transplant status: Secondary | ICD-10-CM | POA: Diagnosis not present

## 2017-05-17 DIAGNOSIS — Z961 Presence of intraocular lens: Secondary | ICD-10-CM | POA: Diagnosis not present

## 2017-05-19 DIAGNOSIS — E786 Lipoprotein deficiency: Secondary | ICD-10-CM | POA: Diagnosis not present

## 2017-05-19 DIAGNOSIS — Z0000 Encounter for general adult medical examination without abnormal findings: Secondary | ICD-10-CM | POA: Diagnosis not present

## 2017-05-19 DIAGNOSIS — F338 Other recurrent depressive disorders: Secondary | ICD-10-CM | POA: Diagnosis not present

## 2017-05-19 DIAGNOSIS — Z87442 Personal history of urinary calculi: Secondary | ICD-10-CM | POA: Diagnosis not present

## 2017-05-19 DIAGNOSIS — N189 Chronic kidney disease, unspecified: Secondary | ICD-10-CM | POA: Diagnosis not present

## 2017-05-19 DIAGNOSIS — Z86010 Personal history of colonic polyps: Secondary | ICD-10-CM | POA: Diagnosis not present

## 2017-05-19 DIAGNOSIS — I10 Essential (primary) hypertension: Secondary | ICD-10-CM | POA: Diagnosis not present

## 2017-05-19 DIAGNOSIS — Z125 Encounter for screening for malignant neoplasm of prostate: Secondary | ICD-10-CM | POA: Diagnosis not present

## 2017-05-26 ENCOUNTER — Encounter: Payer: Self-pay | Admitting: Nurse Practitioner

## 2017-05-26 ENCOUNTER — Ambulatory Visit: Payer: Self-pay | Admitting: Nurse Practitioner

## 2017-05-26 VITALS — BP 130/70 | HR 79 | Temp 97.9°F | Wt 187.2 lb

## 2017-05-26 DIAGNOSIS — N390 Urinary tract infection, site not specified: Secondary | ICD-10-CM

## 2017-05-26 DIAGNOSIS — R350 Frequency of micturition: Secondary | ICD-10-CM

## 2017-05-26 LAB — POC URINALSYSI DIPSTICK (AUTOMATED)
Bilirubin, UA: NEGATIVE
Glucose, UA: NEGATIVE
Ketones, UA: NEGATIVE
Leukocytes, UA: NEGATIVE
NITRITE UA: NEGATIVE
PH UA: 5 (ref 5.0–8.0)
Protein, UA: POSITIVE — AB
SPEC GRAV UA: 1.02 (ref 1.010–1.025)
UROBILINOGEN UA: 0.2 U/dL

## 2017-05-26 MED ORDER — CIPROFLOXACIN HCL 500 MG PO TABS: 500.0000 mg | ORAL_TABLET | Freq: Two times a day (BID) | ORAL | 0 refills | Status: AC

## 2017-05-26 MED ORDER — PHENAZOPYRIDINE HCL 100 MG PO TABS: 100.0000 mg | ORAL_TABLET | Freq: Three times a day (TID) | ORAL | 0 refills | Status: AC | PRN

## 2017-05-26 MED ORDER — TAMSULOSIN HCL 0.4 MG PO CAPS: 0.4000 mg | ORAL_CAPSULE | Freq: Every day | ORAL | 0 refills | Status: AC

## 2017-05-26 MED FILL — CIPROFLOXACIN HCL 500 MG TA: 500 | 7 days supply | Qty: 14 | Fill #0

## 2017-05-26 MED FILL — TAMSULOSIN HCL 0.4 MG CAP: 0.4 | 10 days supply | Qty: 10 | Fill #0

## 2017-05-26 NOTE — Progress Notes (Addendum)
Subjective:    Patrick Myers is a 57 y.o. male who complains of dysuria, frequency, nausea, pain in the right flank, vomiting and  for 2 days.  Patient states the right flank pain had a sudden onset of last p.m.  The patient states the right flank does have some tenderness.  Patient denies back pain, fever and stomach ache.  Patient does not have a history of recurrent UTI.  Patient does not have a history of pyelonephritis.  Patient informed provider that he had a physical last week, at which time his prostate, and PSA levels were checked.  Patient states the doctor informed he did not have any problems and that everything was normal.  Patient states he has a history of kidney stones that occurred about 7 to 8 years ago.  The patient states the stones had to be broken up with lithotripsy.  Patient also informed that he had a renal ultrasound last year, and there were no abnormal findings at that time.  The following portions of the patient's history were reviewed and updated as appropriate: allergies, current medications and past medical history.    Review of Systems Constitutional: negative Eyes: negative Ears, nose, mouth, throat, and face: negative Respiratory: negative Cardiovascular: negative Gastrointestinal: positive for nausea and vomiting, negative for change in bowel habits, constipation and diarrhea Genitourinary:positive for decreased stream, dysuria and frequency, negative for genital lesions, penile discharge and sexual problems, hesitancy, nocturia and urinary incontinence    Objective:    BP 130/70   Pulse 79   Temp 97.9 F (36.6 C)   Wt 187 lb 3.2 oz (84.9 kg)   SpO2 98%  General: alert, cooperative and no distress  Abdomen: tenderness mild in the lower abdomen, without guarding and without rebound in the lower abdomen  Back: back muscles are full ROM, CVA tenderness absent  GU: defer exam   Laboratory:  Urine dipstick shows sp gravity 1.020, negative for glucose,  moderate for hemoglobin, negative for ketones, negative for leukocyte esterase, negative for nitrites, trace for protein and 0.2 for urobilinogen.  Ph: 5.0 Micro exam: not done.    Assessment:    Acute cystitis    Plan: Plan:    1. Medications: ciprofloxacin 2. Maintain adequate hydration 3. Follow up if symptoms not improving, and prn.   4.  FOLLOW UP WITH UROLOGIST IF SYMPTOMS DO NOT IMPROVE.  WILL ALSO NEED TO FOLLOW UP WITH UROLOGY IF URINARY SYMPTOMS IMPROVE, BUT CONTINUE TO HAVE DECREASED URINARY STREAM/FLOW. 5.  Patient education provided.  Discussed with patient to avoid caffeine, to include, teas, sodas, or any other forms of caffeine. 6.  Patient instructed to follow-up in the emergency room if his right flank pain returns or worsens. 7.  Patient verbalizes understanding and has no questions at time of discharge.

## 2017-05-26 NOTE — Patient Instructions (Signed)
Urinary Tract Infection, Adult A urinary tract infection (UTI) is an infection of any part of the urinary tract, which includes the kidneys, ureters, bladder, and urethra. These organs make, store, and get rid of urine in the body. UTI can be a bladder infection (cystitis) or kidney infection (pyelonephritis). What are the causes? This infection may be caused by fungi, viruses, or bacteria. Bacteria are the most common cause of UTIs. This condition can also be caused by repeated incomplete emptying of the bladder during urination. What increases the risk? This condition is more likely to develop if:  You ignore your need to urinate or hold urine for long periods of time.  You do not empty your bladder completely during urination.  You wipe back to front after urinating or having a bowel movement, if you are male.  You are uncircumcised, if you are male.  You are constipated.  You have a urinary catheter that stays in place (indwelling).  You have a weak defense (immune) system.  You have a medical condition that affects your bowels, kidneys, or bladder.  You have diabetes.  You take antibiotic medicines frequently or for long periods of time, and the antibiotics no longer work well against certain types of infections (antibiotic resistance).  You take medicines that irritate your urinary tract.  You are exposed to chemicals that irritate your urinary tract.  You are male.  What are the signs or symptoms? Symptoms of this condition include:  Fever.  Frequent urination or passing small amounts of urine frequently.  Needing to urinate urgently.  Pain or burning with urination.  Urine that smells bad or unusual.  Cloudy urine.  Pain in the lower abdomen or back.  Trouble urinating.  Blood in the urine.  Vomiting or being less hungry than normal.  Diarrhea or abdominal pain.  Vaginal discharge, if you are male.  How is this diagnosed? This condition is  diagnosed with a medical history and physical exam. You will also need to provide a urine sample to test your urine. Other tests may be done, including:  Blood tests.  Sexually transmitted disease (STD) testing.  If you have had more than one UTI, a cystoscopy or imaging studies may be done to determine the cause of the infections. How is this treated? Treatment for this condition often includes a combination of two or more of the following:  Antibiotic medicine.  Other medicines to treat less common causes of UTI.  Over-the-counter medicines to treat pain.  Drinking enough water to stay hydrated.  Follow these instructions at home:  Take over-the-counter and prescription medicines only as told by your health care provider.  If you were prescribed an antibiotic, take it as told by your health care provider. Do not stop taking the antibiotic even if you start to feel better.  Avoid alcohol, caffeine, tea, and carbonated beverages. They can irritate your bladder.  Drink enough fluid to keep your urine clear or pale yellow.  Keep all follow-up visits as told by your health care provider. This is important.  Make sure to: ? Empty your bladder often and completely. Do not hold urine for long periods of time. ? Empty your bladder before and after sex. ? Wipe from front to back after a bowel movement if you are male. Use each tissue one time when you wipe. FOLLOW UP WITH UROLOGIST IF SYMPTOMS DO NOT IMPROVE.  WILL ALSO NEED TO FOLLOW UP WITH UROLOGY IF URINARY SYMPTOMS IMPROVE, BUT CONTINUE TO HAVE DECREASED  URINARY STREAM/FLOW.   Contact a health care provider if:  You have back pain.  You have a fever.  You feel nauseous or vomit.  Your symptoms do not get better after 3 days.  Your symptoms go away and then return. Get help right away if:  You have severe back pain or lower abdominal pain.  You are vomiting and cannot keep down any medicines or water. This  information is not intended to replace advice given to you by your health care provider. Make sure you discuss any questions you have with your health care provider. Document Released: 09/29/2004 Document Revised: 06/03/2015 Document Reviewed: 11/10/2014 Elsevier Interactive Patient Education  [employer].

## 2017-06-02 MED FILL — CLORAZEPATE 7.5 MG TABLET: 7.5 | 30 days supply | Qty: 90 | Fill #1

## 2017-06-02 MED FILL — LOSARTAN POTASSIUM 100 MG T: 100 | 90 days supply | Qty: 90 | Fill #0

## 2017-06-23 MED FILL — PREDNISOLONE AC 1% EYE DROP: 1 | 90 days supply | Qty: 5 | Fill #0

## 2017-07-20 DIAGNOSIS — F3341 Major depressive disorder, recurrent, in partial remission: Secondary | ICD-10-CM | POA: Diagnosis not present

## 2017-08-09 MED FILL — CLORAZEPATE 7.5 MG TABLET: 7.5 | 25 days supply | Qty: 25 | Fill #0

## 2017-08-23 MED FILL — SERTRALINE HCL 100 MG TAB: 100 | 90 days supply | Qty: 135 | Fill #2

## 2017-08-31 MED FILL — LOSARTAN POTASSIUM 100 MG T: 100 | 90 days supply | Qty: 90 | Fill #1

## 2017-09-25 MED FILL — CLORAZEPATE 7.5 MG TABLET: 7.5 | 25 days supply | Qty: 25 | Fill #1

## 2017-10-02 DIAGNOSIS — N189 Chronic kidney disease, unspecified: Secondary | ICD-10-CM | POA: Diagnosis not present

## 2017-10-02 DIAGNOSIS — I10 Essential (primary) hypertension: Secondary | ICD-10-CM | POA: Diagnosis not present

## 2017-10-02 DIAGNOSIS — N183 Chronic kidney disease, stage 3 (moderate): Secondary | ICD-10-CM | POA: Diagnosis not present

## 2017-10-02 DIAGNOSIS — N2581 Secondary hyperparathyroidism of renal origin: Secondary | ICD-10-CM | POA: Diagnosis not present

## 2017-10-02 DIAGNOSIS — D631 Anemia in chronic kidney disease: Secondary | ICD-10-CM | POA: Diagnosis not present

## 2017-10-04 DIAGNOSIS — Z961 Presence of intraocular lens: Secondary | ICD-10-CM | POA: Diagnosis not present

## 2017-10-04 DIAGNOSIS — H1851 Endothelial corneal dystrophy: Secondary | ICD-10-CM | POA: Diagnosis not present

## 2017-10-04 DIAGNOSIS — H2512 Age-related nuclear cataract, left eye: Secondary | ICD-10-CM | POA: Diagnosis not present

## 2017-10-04 DIAGNOSIS — Z9889 Other specified postprocedural states: Secondary | ICD-10-CM | POA: Diagnosis not present

## 2017-10-04 DIAGNOSIS — R0683 Snoring: Secondary | ICD-10-CM | POA: Insufficient documentation

## 2017-10-24 MED FILL — CLORAZEPATE 7.5 MG TABLET: 7.5 | 25 days supply | Qty: 25 | Fill #2

## 2017-10-26 DIAGNOSIS — H2512 Age-related nuclear cataract, left eye: Secondary | ICD-10-CM | POA: Diagnosis not present

## 2017-10-26 DIAGNOSIS — K219 Gastro-esophageal reflux disease without esophagitis: Secondary | ICD-10-CM | POA: Diagnosis not present

## 2017-10-26 DIAGNOSIS — I10 Essential (primary) hypertension: Secondary | ICD-10-CM | POA: Diagnosis not present

## 2017-10-26 DIAGNOSIS — H1851 Endothelial corneal dystrophy: Secondary | ICD-10-CM | POA: Diagnosis not present

## 2017-11-15 MED FILL — PREDNISOLONE AC 1% EYE DROP: 1 | 38 days supply | Qty: 15 | Fill #0

## 2017-11-17 MED FILL — CLORAZEPATE 7.5 MG TABLET: 7.5 | 25 days supply | Qty: 25 | Fill #3

## 2017-12-11 MED FILL — LOSARTAN POTASSIUM 100 MG T: 100 | 90 days supply | Qty: 90 | Fill #2

## 2017-12-19 DIAGNOSIS — F3341 Major depressive disorder, recurrent, in partial remission: Secondary | ICD-10-CM | POA: Diagnosis not present

## 2017-12-28 MED FILL — SERTRALINE HCL 100 MG TAB: 100 | 30 days supply | Qty: 60 | Fill #0

## 2017-12-28 MED FILL — CLORAZEPATE 7.5 MG TABLET: 7.5 | 25 days supply | Qty: 25 | Fill #4

## 2018-01-29 MED FILL — SERTRALINE HCL 100 MG TAB: 100 | 30 days supply | Qty: 60 | Fill #1

## 2018-02-16 DIAGNOSIS — H26492 Other secondary cataract, left eye: Secondary | ICD-10-CM | POA: Diagnosis not present

## 2018-03-05 MED FILL — PREDNISOLONE AC 1% EYE DROP: 1 | 90 days supply | Qty: 5 | Fill #1 | Status: TO

## 2018-03-14 MED FILL — SERTRALINE HCL 100 MG TAB: 100 | 30 days supply | Qty: 60 | Fill #2 | Status: TO

## 2018-03-19 MED FILL — LOSARTAN POTASSIUM 100 MG T: 100 | 90 days supply | Qty: 90 | Fill #3

## 2018-03-29 DIAGNOSIS — H905 Unspecified sensorineural hearing loss: Secondary | ICD-10-CM | POA: Diagnosis not present

## 2018-04-07 MED FILL — SERTRALINE HCL 100 MG TAB: 100 | 30 days supply | Qty: 60 | Fill #0

## 2018-04-11 MED FILL — CLORAZEPATE 7.5 MG TABLET: 7.5 | 30 days supply | Qty: 30 | Fill #0

## 2018-04-26 MED FILL — PREDNISOLONE AC 1% EYE DROP: 1 | 25 days supply | Qty: 5 | Fill #0

## 2018-05-25 DIAGNOSIS — Z87442 Personal history of urinary calculi: Secondary | ICD-10-CM | POA: Diagnosis not present

## 2018-05-25 DIAGNOSIS — Z0000 Encounter for general adult medical examination without abnormal findings: Secondary | ICD-10-CM | POA: Diagnosis not present

## 2018-05-25 DIAGNOSIS — I10 Essential (primary) hypertension: Secondary | ICD-10-CM | POA: Diagnosis not present

## 2018-05-25 DIAGNOSIS — N189 Chronic kidney disease, unspecified: Secondary | ICD-10-CM | POA: Diagnosis not present

## 2018-05-25 DIAGNOSIS — E782 Mixed hyperlipidemia: Secondary | ICD-10-CM | POA: Diagnosis not present

## 2018-05-25 DIAGNOSIS — Z86010 Personal history of colonic polyps: Secondary | ICD-10-CM | POA: Diagnosis not present

## 2018-05-25 DIAGNOSIS — F338 Other recurrent depressive disorders: Secondary | ICD-10-CM | POA: Diagnosis not present

## 2018-05-25 DIAGNOSIS — Z125 Encounter for screening for malignant neoplasm of prostate: Secondary | ICD-10-CM | POA: Diagnosis not present

## 2018-05-25 DIAGNOSIS — F419 Anxiety disorder, unspecified: Secondary | ICD-10-CM | POA: Diagnosis not present

## 2018-06-20 MED FILL — CLORAZEPATE 7.5 MG TABLET: 7.5 | 30 days supply | Qty: 30 | Fill #1

## 2018-06-25 MED FILL — LOSARTAN POTASSIUM 100 MG T: 100 | 30 days supply | Qty: 30 | Fill #0

## 2018-06-26 MED FILL — PREDNISOLONE AC 1% EYE DROP: 1 | 25 days supply | Qty: 5 | Fill #1

## 2018-07-24 MED FILL — LOSARTAN POTASSIUM 100 MG T: 100 | 30 days supply | Qty: 30 | Fill #1

## 2018-08-14 MED FILL — SERTRALINE HCL 100 MG TAB: 100 | 30 days supply | Qty: 60 | Fill #0

## 2018-08-21 MED FILL — PREDNISOLONE AC 1% EYE DROP: 1 | 25 days supply | Qty: 5 | Fill #2

## 2018-08-31 MED FILL — LOSARTAN POTASSIUM 100 MG T: 100 | 30 days supply | Qty: 30 | Fill #2

## 2018-10-01 MED FILL — LOSARTAN POTASSIUM 100 MG T: 100 | 90 days supply | Qty: 90 | Fill #0

## 2018-10-03 DIAGNOSIS — H02831 Dermatochalasis of right upper eyelid: Secondary | ICD-10-CM | POA: Diagnosis not present

## 2018-10-03 DIAGNOSIS — Z947 Corneal transplant status: Secondary | ICD-10-CM | POA: Diagnosis not present

## 2018-10-03 DIAGNOSIS — H02403 Unspecified ptosis of bilateral eyelids: Secondary | ICD-10-CM | POA: Diagnosis not present

## 2018-10-03 DIAGNOSIS — Z961 Presence of intraocular lens: Secondary | ICD-10-CM | POA: Diagnosis not present

## 2018-10-03 DIAGNOSIS — H02834 Dermatochalasis of left upper eyelid: Secondary | ICD-10-CM | POA: Diagnosis not present

## 2018-10-15 MED FILL — SERTRALINE HCL 100 MG TAB: 100 | 30 days supply | Qty: 60 | Fill #1

## 2018-10-29 MED FILL — PREDNISOLONE AC 1% EYE DROP: 1 | 25 days supply | Qty: 5 | Fill #3

## 2018-11-08 DIAGNOSIS — N182 Chronic kidney disease, stage 2 (mild): Secondary | ICD-10-CM | POA: Diagnosis not present

## 2018-11-08 DIAGNOSIS — D631 Anemia in chronic kidney disease: Secondary | ICD-10-CM | POA: Diagnosis not present

## 2018-11-08 DIAGNOSIS — N189 Chronic kidney disease, unspecified: Secondary | ICD-10-CM | POA: Diagnosis not present

## 2018-11-08 DIAGNOSIS — I10 Essential (primary) hypertension: Secondary | ICD-10-CM | POA: Diagnosis not present

## 2018-12-24 MED FILL — SERTRALINE HCL 100 MG TAB: 100 | 30 days supply | Qty: 60 | Fill #2

## 2018-12-24 MED FILL — LOSARTAN POTASSIUM 100 MG T: 100 | 90 days supply | Qty: 90 | Fill #1

## 2019-01-29 MED FILL — PREDNISOLONE AC 1% EYE DROP: 1 | 50 days supply | Qty: 5 | Fill #4

## 2019-03-15 MED FILL — SERTRALINE HCL 100 MG TAB: 100 | 90 days supply | Qty: 90 | Fill #0

## 2019-04-08 MED FILL — LOSARTAN POTASSIUM 100 MG T: 100 | 90 days supply | Qty: 90 | Fill #2

## 2019-04-09 MED FILL — PREDNISOLONE AC 1% EYE DROP: 1 | 50 days supply | Qty: 5 | Fill #5

## 2019-04-26 DIAGNOSIS — Z961 Presence of intraocular lens: Secondary | ICD-10-CM | POA: Diagnosis not present

## 2019-04-26 DIAGNOSIS — Z947 Corneal transplant status: Secondary | ICD-10-CM | POA: Diagnosis not present

## 2019-04-26 DIAGNOSIS — H02834 Dermatochalasis of left upper eyelid: Secondary | ICD-10-CM | POA: Diagnosis not present

## 2019-04-26 DIAGNOSIS — H02831 Dermatochalasis of right upper eyelid: Secondary | ICD-10-CM | POA: Diagnosis not present

## 2019-04-26 DIAGNOSIS — H02403 Unspecified ptosis of bilateral eyelids: Secondary | ICD-10-CM | POA: Diagnosis not present

## 2019-05-10 ENCOUNTER — Other Ambulatory Visit (HOSPITAL_COMMUNITY): Payer: Self-pay | Admitting: Ophthalmology

## 2019-05-10 MED FILL — SHINGRIX 50 MCG SUS: 50 | 1 days supply | Qty: 1 | Fill #0

## 2019-05-20 MED FILL — PREDNISOLONE AC 1% EYE DROP: 1 | 90 days supply | Qty: 10 | Fill #0

## 2019-05-27 DIAGNOSIS — H26491 Other secondary cataract, right eye: Secondary | ICD-10-CM | POA: Diagnosis not present

## 2019-06-05 DIAGNOSIS — N1831 Chronic kidney disease, stage 3a: Secondary | ICD-10-CM | POA: Diagnosis not present

## 2019-06-05 DIAGNOSIS — Z87442 Personal history of urinary calculi: Secondary | ICD-10-CM | POA: Diagnosis not present

## 2019-06-05 DIAGNOSIS — F338 Other recurrent depressive disorders: Secondary | ICD-10-CM | POA: Diagnosis not present

## 2019-06-05 DIAGNOSIS — F419 Anxiety disorder, unspecified: Secondary | ICD-10-CM | POA: Diagnosis not present

## 2019-06-05 DIAGNOSIS — Z125 Encounter for screening for malignant neoplasm of prostate: Secondary | ICD-10-CM | POA: Diagnosis not present

## 2019-06-05 DIAGNOSIS — Z86010 Personal history of colonic polyps: Secondary | ICD-10-CM | POA: Diagnosis not present

## 2019-06-05 DIAGNOSIS — Z0000 Encounter for general adult medical examination without abnormal findings: Secondary | ICD-10-CM | POA: Diagnosis not present

## 2019-06-05 DIAGNOSIS — L84 Corns and callosities: Secondary | ICD-10-CM | POA: Diagnosis not present

## 2019-06-05 DIAGNOSIS — E782 Mixed hyperlipidemia: Secondary | ICD-10-CM | POA: Diagnosis not present

## 2019-06-05 DIAGNOSIS — I10 Essential (primary) hypertension: Secondary | ICD-10-CM | POA: Diagnosis not present

## 2019-06-17 MED FILL — SERTRALINE HCL 100 MG TAB: 100 | 90 days supply | Qty: 90 | Fill #1

## 2019-06-21 MED FILL — CARVEDILOL 6.25 MG TABLET: 6.25 | 90 days supply | Qty: 180 | Fill #0

## 2019-07-02 ENCOUNTER — Ambulatory Visit (INDEPENDENT_AMBULATORY_CARE_PROVIDER_SITE_OTHER): Payer: 59 | Admitting: Sports Medicine

## 2019-07-02 ENCOUNTER — Encounter: Payer: Self-pay | Admitting: Sports Medicine

## 2019-07-02 ENCOUNTER — Other Ambulatory Visit: Payer: Self-pay

## 2019-07-02 VITALS — BP 147/82 | HR 56 | Temp 97.3°F | Resp 16

## 2019-07-02 DIAGNOSIS — L84 Corns and callosities: Secondary | ICD-10-CM | POA: Diagnosis not present

## 2019-07-02 DIAGNOSIS — M2041 Other hammer toe(s) (acquired), right foot: Secondary | ICD-10-CM

## 2019-07-02 DIAGNOSIS — M79674 Pain in right toe(s): Secondary | ICD-10-CM | POA: Diagnosis not present

## 2019-07-02 NOTE — Progress Notes (Signed)
Subjective: JACINTO KEIL is a 59 y.o. male patient who presents to office for evaluation of Right foot pain secondary to callus skin. Patient complains of pain at the lesion present in between the fourth and fifth toes on the right foot. Patient has tried spacer/cushion for the corn with no relief in symptoms.  Reports that pain is so bad sometimes with walking excessively has pain that affects her knees hips and low back and feels like the corn and the pain in the toe is throwing his gait off.  Patient denies any other pedal complaints.   Review of systems noncontributory  Patient Active Problem List   Diagnosis Date Noted  . Snores 10/04/2017  . 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 Cataract 12/18/2012  . Depression 12/18/2012  . Hearing loss 12/18/2012  . History of stomach ulcers 12/18/2012  . MRSA (methicillin resistant Staphylococcus aureus) 12/18/2012  . Seasonal allergies 12/18/2012  . Fuchs' corneal dystrophy 10/17/2012    Current Outpatient Medications on File Prior to Visit  Medication Sig Dispense Refill  . carvedilol (COREG) 6.25 MG tablet Take by mouth.    . Cholecalciferol 125 MCG (5000 UT) TABS Vitamin D3    . fluticasone (FLONASE) 50 MCG/ACT nasal spray Place 1 spray into both nostrils daily.    Marland Kitchen losartan (COZAAR) 100 MG tablet Take 100 mg by mouth daily.    . prednisoLONE acetate (PRED FORTE) 1 % ophthalmic suspension Place 1 drop into the right eye daily.    . sertraline (ZOLOFT) 100 MG tablet 100 mg every morning.    Marland Kitchen VITAMIN E PO Take 180 mg by mouth daily.     No current facility-administered medications on file prior to visit.    No Known Allergies  Objective:  General: Alert and oriented x3 in no acute distress  Dermatology: Keratotic lesion present right fourth interspace with skin lines transversing the lesion, pain is present with direct pressure to the lesion with a central nucleated core noted greater on the medial side of the fifth toe as  compared to the lateral side of the fourth toe, no webspace macerations, no ecchymosis bilateral, all nails x 10 are well manicured.  Vascular: Dorsalis Pedis and Posterior Tibial pedal pulses 2/4, Capillary Fill Time 3 seconds, + pedal hair growth bilateral, no edema bilateral lower extremities, Temperature gradient within normal limits.  Neurology: Johney Maine sensation intact via light touch bilateral.  Musculoskeletal: Mild tenderness with palpation at the keratotic lesion site on Right with varus rotated fifth hammertoe as well as early bunion deformity, Muscular strength 5/5 in all groups without pain or limitation on range of motion.  Assessment and Plan: Problem List Items Addressed This Visit    None    Visit Diagnoses    Corns and callosities    -  Primary   Hammertoe of right foot       Toe pain, right          -Complete examination performed -Discussed treatment options -Parred keratoic lesion using a chisel blade x1 at the right fourth interspace -Dispensed toe cap for right fifth toe -Advised good supportive shoes and inserts -Patient to return to office as needed or sooner if condition worsens.  Advised patient if pain recurs may benefit from a steroid injection at the corn site versus considering surgery to correct toe deformity  Landis Martins, DPM

## 2019-07-10 MED FILL — SHINGRIX 50 MCG SUS: 50 | 1 days supply | Qty: 1 | Fill #1

## 2019-07-15 ENCOUNTER — Other Ambulatory Visit (HOSPITAL_COMMUNITY): Payer: Self-pay | Admitting: Family Medicine

## 2019-07-15 MED FILL — LOSARTAN POTASSIUM 100 MG T: 100 | 90 days supply | Qty: 90 | Fill #0

## 2019-09-12 ENCOUNTER — Other Ambulatory Visit (HOSPITAL_COMMUNITY): Payer: Self-pay | Admitting: Family Medicine

## 2019-09-12 MED FILL — CARVEDILOL 12.5 MG TABLET: 12.5 | 90 days supply | Qty: 180 | Fill #0

## 2019-09-24 ENCOUNTER — Other Ambulatory Visit (HOSPITAL_COMMUNITY): Payer: Self-pay | Admitting: Family Medicine

## 2019-09-24 MED FILL — SERTRALINE HCL 100 MG TAB: 100 | 90 days supply | Qty: 90 | Fill #0

## 2019-10-14 MED FILL — LOSARTAN POTASSIUM 100 MG T: 100 | 90 days supply | Qty: 90 | Fill #1

## 2019-11-04 MED FILL — PREDNISOLONE AC 1% EYE DROP: 1 | 90 days supply | Qty: 10 | Fill #1

## 2019-11-13 ENCOUNTER — Other Ambulatory Visit: Payer: 59

## 2019-11-20 ENCOUNTER — Other Ambulatory Visit: Payer: 59

## 2019-11-21 ENCOUNTER — Other Ambulatory Visit: Payer: 59

## 2019-11-21 DIAGNOSIS — Z20822 Contact with and (suspected) exposure to covid-19: Secondary | ICD-10-CM

## 2019-11-22 LAB — SARS-COV-2, NAA 2 DAY TAT

## 2019-11-22 LAB — NOVEL CORONAVIRUS, NAA: SARS-CoV-2, NAA: NOT DETECTED

## 2019-12-16 MED FILL — CARVEDILOL 12.5 MG TABLET: 12.5 | 90 days supply | Qty: 180 | Fill #1

## 2020-01-23 MED FILL — LOSARTAN POTASSIUM 100 MG T: 100 | 30 days supply | Qty: 30 | Fill #2

## 2020-01-23 MED FILL — SERTRALINE HCL 100 MG TAB: 100 | 90 days supply | Qty: 90 | Fill #1

## 2020-02-26 MED FILL — LOSARTAN POTASSIUM 100 MG T: 100 | 30 days supply | Qty: 30 | Fill #3

## 2020-03-11 DIAGNOSIS — H02403 Unspecified ptosis of bilateral eyelids: Secondary | ICD-10-CM | POA: Diagnosis not present

## 2020-03-11 DIAGNOSIS — Z947 Corneal transplant status: Secondary | ICD-10-CM | POA: Diagnosis not present

## 2020-03-11 DIAGNOSIS — Z961 Presence of intraocular lens: Secondary | ICD-10-CM | POA: Diagnosis not present

## 2020-03-11 DIAGNOSIS — H26491 Other secondary cataract, right eye: Secondary | ICD-10-CM | POA: Diagnosis not present

## 2020-04-02 MED FILL — LOSARTAN POTASSIUM 100 MG T: 100 | 30 days supply | Qty: 30 | Fill #4

## 2020-04-03 ENCOUNTER — Other Ambulatory Visit (HOSPITAL_COMMUNITY): Payer: Self-pay | Admitting: Family Medicine

## 2020-04-08 ENCOUNTER — Other Ambulatory Visit (HOSPITAL_COMMUNITY): Payer: Self-pay

## 2020-04-09 ENCOUNTER — Other Ambulatory Visit (HOSPITAL_COMMUNITY): Payer: Self-pay

## 2020-04-09 MED ORDER — CARVEDILOL 12.5 MG PO TABS
12.5000 mg | ORAL_TABLET | Freq: Two times a day (BID) | ORAL | 0 refills | Status: AC
  Filled 2020-04-09: qty 180, 90d supply, fill #0

## 2020-04-10 ENCOUNTER — Other Ambulatory Visit (HOSPITAL_COMMUNITY): Payer: Self-pay

## 2020-04-22 ENCOUNTER — Other Ambulatory Visit (HOSPITAL_COMMUNITY): Payer: Self-pay

## 2020-04-22 MED FILL — Losartan Potassium Tab 100 MG: ORAL | 90 days supply | Qty: 90 | Fill #0 | Status: CN

## 2020-04-22 MED FILL — Prednisolone Acetate Ophth Susp 1%: OPHTHALMIC | 75 days supply | Qty: 10 | Fill #0 | Status: AC

## 2020-04-22 MED FILL — Prednisolone Acetate Ophth Susp 1%: OPHTHALMIC | 90 days supply | Qty: 15 | Fill #0 | Status: CN

## 2020-04-23 ENCOUNTER — Other Ambulatory Visit (HOSPITAL_COMMUNITY): Payer: Self-pay

## 2020-04-23 MED FILL — Losartan Potassium Tab 100 MG: ORAL | 90 days supply | Qty: 90 | Fill #0 | Status: AC

## 2020-04-23 MED FILL — Losartan Potassium Tab 100 MG: ORAL | 90 days supply | Qty: 90 | Fill #0 | Status: CN

## 2020-06-24 ENCOUNTER — Other Ambulatory Visit (HOSPITAL_COMMUNITY): Payer: Self-pay | Admitting: Family Medicine

## 2020-06-24 ENCOUNTER — Other Ambulatory Visit (HOSPITAL_COMMUNITY): Payer: Self-pay

## 2020-06-24 DIAGNOSIS — Z0000 Encounter for general adult medical examination without abnormal findings: Secondary | ICD-10-CM | POA: Diagnosis not present

## 2020-06-24 DIAGNOSIS — Z125 Encounter for screening for malignant neoplasm of prostate: Secondary | ICD-10-CM | POA: Diagnosis not present

## 2020-06-24 DIAGNOSIS — I10 Essential (primary) hypertension: Secondary | ICD-10-CM | POA: Diagnosis not present

## 2020-06-24 DIAGNOSIS — Z5181 Encounter for therapeutic drug level monitoring: Secondary | ICD-10-CM | POA: Diagnosis not present

## 2020-06-24 DIAGNOSIS — E782 Mixed hyperlipidemia: Secondary | ICD-10-CM | POA: Diagnosis not present

## 2020-06-24 MED ORDER — HYDROCHLOROTHIAZIDE 25 MG PO TABS
25.0000 mg | ORAL_TABLET | Freq: Every day | ORAL | 2 refills | Status: AC
  Filled 2020-06-24: qty 90, 90d supply, fill #0
  Filled 2020-09-28: qty 90, 90d supply, fill #1
  Filled 2021-01-06: qty 90, 90d supply, fill #2

## 2020-06-30 ENCOUNTER — Other Ambulatory Visit: Payer: Self-pay

## 2020-06-30 ENCOUNTER — Ambulatory Visit (HOSPITAL_BASED_OUTPATIENT_CLINIC_OR_DEPARTMENT_OTHER)
Admission: RE | Admit: 2020-06-30 | Discharge: 2020-06-30 | Disposition: A | Discharge status: Home / Self Care | Payer: Self-pay | Source: Ambulatory Visit | Attending: Family Medicine | Admitting: Family Medicine

## 2020-06-30 DIAGNOSIS — I700 Atherosclerosis of aorta: Secondary | ICD-10-CM | POA: Insufficient documentation

## 2020-06-30 DIAGNOSIS — E782 Mixed hyperlipidemia: Secondary | ICD-10-CM | POA: Insufficient documentation

## 2020-07-30 DIAGNOSIS — R972 Elevated prostate specific antigen [PSA]: Secondary | ICD-10-CM | POA: Diagnosis not present

## 2020-08-02 ENCOUNTER — Other Ambulatory Visit (HOSPITAL_COMMUNITY): Payer: Self-pay

## 2020-08-03 ENCOUNTER — Other Ambulatory Visit (HOSPITAL_COMMUNITY): Payer: Self-pay

## 2020-08-03 MED ORDER — LOSARTAN POTASSIUM 100 MG PO TABS
100.0000 mg | ORAL_TABLET | Freq: Every day | ORAL | 3 refills | Status: AC
  Filled 2020-08-03: qty 90, 90d supply, fill #0
  Filled 2020-11-16: qty 90, 90d supply, fill #1

## 2020-08-06 ENCOUNTER — Ambulatory Visit
Admission: RE | Admit: 2020-08-06 | Discharge: 2020-08-06 | Disposition: A | Discharge status: Home / Self Care | Payer: 59 | Source: Ambulatory Visit | Attending: Family Medicine | Admitting: Family Medicine

## 2020-08-06 ENCOUNTER — Other Ambulatory Visit: Payer: Self-pay | Admitting: Family Medicine

## 2020-08-06 ENCOUNTER — Other Ambulatory Visit (HOSPITAL_COMMUNITY): Payer: Self-pay

## 2020-08-06 DIAGNOSIS — R1032 Left lower quadrant pain: Secondary | ICD-10-CM | POA: Diagnosis not present

## 2020-08-06 MED ORDER — TAMSULOSIN HCL 0.4 MG PO CAPS
0.4000 mg | ORAL_CAPSULE | Freq: Every day | ORAL | 0 refills | Status: AC
  Filled 2020-08-06: qty 30, 30d supply, fill #0

## 2020-08-06 MED ORDER — TRAMADOL HCL 50 MG PO TABS
50.0000 mg | ORAL_TABLET | Freq: Three times a day (TID) | ORAL | 0 refills | Status: AC | PRN
  Filled 2020-08-06: qty 20, 4d supply, fill #0

## 2020-08-19 MED FILL — Sertraline HCl Tab 100 MG: ORAL | 90 days supply | Qty: 90 | Fill #0 | Status: AC

## 2020-08-20 ENCOUNTER — Other Ambulatory Visit (HOSPITAL_COMMUNITY): Payer: Self-pay

## 2020-09-28 ENCOUNTER — Other Ambulatory Visit (HOSPITAL_COMMUNITY): Payer: Self-pay

## 2020-10-01 ENCOUNTER — Other Ambulatory Visit (HOSPITAL_COMMUNITY): Payer: Self-pay

## 2020-11-16 ENCOUNTER — Other Ambulatory Visit (HOSPITAL_COMMUNITY): Payer: Self-pay

## 2020-11-30 ENCOUNTER — Other Ambulatory Visit (HOSPITAL_BASED_OUTPATIENT_CLINIC_OR_DEPARTMENT_OTHER): Payer: Self-pay

## 2020-11-30 MED ORDER — PREDNISOLONE ACETATE 1 % OP SUSP
OPHTHALMIC | 11 refills | Status: AC
  Filled 2020-11-30: qty 5, 50d supply, fill #0
  Filled 2021-02-01: qty 5, 50d supply, fill #1
  Filled 2021-03-23: qty 5, 50d supply, fill #2

## 2020-12-01 ENCOUNTER — Other Ambulatory Visit (HOSPITAL_COMMUNITY): Payer: Self-pay

## 2020-12-01 ENCOUNTER — Other Ambulatory Visit (HOSPITAL_BASED_OUTPATIENT_CLINIC_OR_DEPARTMENT_OTHER): Payer: Self-pay

## 2020-12-29 ENCOUNTER — Other Ambulatory Visit (HOSPITAL_BASED_OUTPATIENT_CLINIC_OR_DEPARTMENT_OTHER): Payer: Self-pay

## 2020-12-29 ENCOUNTER — Ambulatory Visit: Payer: 59 | Attending: Internal Medicine

## 2020-12-29 DIAGNOSIS — Z23 Encounter for immunization: Secondary | ICD-10-CM

## 2020-12-29 MED ORDER — PFIZER COVID-19 VAC BIVALENT 30 MCG/0.3ML IM SUSP
INTRAMUSCULAR | 0 refills | Status: AC
  Filled 2020-12-29: qty 0.3, 1d supply, fill #0

## 2020-12-30 NOTE — Progress Notes (Signed)
° °  Covid-19 Vaccination Clinic  Name:  Patrick Myers    MRN: [id number] DOB: 1960/02/04  12/30/2020  [name] was observed post Covid-19 immunization for 15 minutes without incident. He was provided with Vaccine Information Sheet and instruction to access the V-Safe system.   [name] was instructed to call 911 with any severe reactions post vaccine: Difficulty breathing  Swelling of face and throat  A fast heartbeat  A bad rash all over body  Dizziness and weakness   Immunizations Administered     Name Date Dose VIS Date Route   Pfizer Covid-19 Vaccine Bivalent Booster 12/29/2020  5:15 PM 0.3 mL 09/02/2020 Intramuscular   Manufacturer: [place]   Lot: [id number]   [place]: [phone]

## 2021-01-06 ENCOUNTER — Other Ambulatory Visit (HOSPITAL_COMMUNITY): Payer: Self-pay

## 2021-01-07 ENCOUNTER — Other Ambulatory Visit (HOSPITAL_COMMUNITY): Payer: Self-pay

## 2021-01-07 MED ORDER — CARESTART COVID-19 HOME TEST VI KIT
PACK | 0 refills | Status: AC
  Filled 2021-01-07: qty 4, 4d supply, fill #0

## 2021-02-01 ENCOUNTER — Other Ambulatory Visit (HOSPITAL_BASED_OUTPATIENT_CLINIC_OR_DEPARTMENT_OTHER): Payer: Self-pay

## 2021-02-19 ENCOUNTER — Other Ambulatory Visit (HOSPITAL_BASED_OUTPATIENT_CLINIC_OR_DEPARTMENT_OTHER): Payer: Self-pay

## 2021-02-19 MED ORDER — PEG 3350-KCL-NABCB-NACL-NASULF 236 G PO SOLR
ORAL | 0 refills | Status: AC
  Filled 2021-02-19: qty 4000, 2d supply, fill #0

## 2021-02-22 ENCOUNTER — Other Ambulatory Visit (HOSPITAL_COMMUNITY): Payer: Self-pay

## 2021-02-22 MED ORDER — LOSARTAN POTASSIUM-HCTZ 100-25 MG PO TABS
1.0000 | ORAL_TABLET | Freq: Every day | ORAL | 3 refills | Status: AC
  Filled 2021-02-22: qty 90, 90d supply, fill #0

## 2021-02-25 DIAGNOSIS — K649 Unspecified hemorrhoids: Secondary | ICD-10-CM | POA: Diagnosis not present

## 2021-02-25 DIAGNOSIS — Z86010 Personal history of colonic polyps: Secondary | ICD-10-CM | POA: Diagnosis not present

## 2021-02-25 DIAGNOSIS — D123 Benign neoplasm of transverse colon: Secondary | ICD-10-CM | POA: Diagnosis not present

## 2021-02-25 DIAGNOSIS — K5730 Diverticulosis of large intestine without perforation or abscess without bleeding: Secondary | ICD-10-CM | POA: Diagnosis not present

## 2021-03-23 ENCOUNTER — Other Ambulatory Visit (HOSPITAL_BASED_OUTPATIENT_CLINIC_OR_DEPARTMENT_OTHER): Payer: Self-pay

## 2021-04-14 DIAGNOSIS — H02403 Unspecified ptosis of bilateral eyelids: Secondary | ICD-10-CM | POA: Diagnosis not present

## 2021-04-14 DIAGNOSIS — H02831 Dermatochalasis of right upper eyelid: Secondary | ICD-10-CM | POA: Diagnosis not present

## 2021-04-14 DIAGNOSIS — Z961 Presence of intraocular lens: Secondary | ICD-10-CM | POA: Diagnosis not present

## 2021-04-14 DIAGNOSIS — Z947 Corneal transplant status: Secondary | ICD-10-CM | POA: Diagnosis not present

## 2021-05-21 ENCOUNTER — Other Ambulatory Visit (HOSPITAL_COMMUNITY): Payer: Self-pay

## 2022-01-06 DIAGNOSIS — E782 Mixed hyperlipidemia: Secondary | ICD-10-CM | POA: Diagnosis not present

## 2022-01-06 DIAGNOSIS — Z125 Encounter for screening for malignant neoplasm of prostate: Secondary | ICD-10-CM | POA: Diagnosis not present

## 2022-01-06 DIAGNOSIS — I10 Essential (primary) hypertension: Secondary | ICD-10-CM | POA: Diagnosis not present

## 2022-01-06 DIAGNOSIS — Z0000 Encounter for general adult medical examination without abnormal findings: Secondary | ICD-10-CM | POA: Diagnosis not present

## 2022-01-14 ENCOUNTER — Other Ambulatory Visit: Payer: Self-pay | Admitting: Neurology

## 2022-01-14 DIAGNOSIS — L281 Prurigo nodularis: Secondary | ICD-10-CM | POA: Diagnosis not present

## 2022-01-14 DIAGNOSIS — L989 Disorder of the skin and subcutaneous tissue, unspecified: Secondary | ICD-10-CM | POA: Diagnosis not present

## 2022-07-29 ENCOUNTER — Other Ambulatory Visit (HOSPITAL_COMMUNITY): Payer: Self-pay

## 2022-09-07 DIAGNOSIS — H02834 Dermatochalasis of left upper eyelid: Secondary | ICD-10-CM | POA: Diagnosis not present

## 2022-09-07 DIAGNOSIS — Z947 Corneal transplant status: Secondary | ICD-10-CM | POA: Diagnosis not present

## 2022-09-07 DIAGNOSIS — Z961 Presence of intraocular lens: Secondary | ICD-10-CM | POA: Diagnosis not present

## 2022-09-07 DIAGNOSIS — H02831 Dermatochalasis of right upper eyelid: Secondary | ICD-10-CM | POA: Diagnosis not present

## 2023-01-05 DIAGNOSIS — E782 Mixed hyperlipidemia: Secondary | ICD-10-CM | POA: Diagnosis not present

## 2023-01-05 DIAGNOSIS — I10 Essential (primary) hypertension: Secondary | ICD-10-CM | POA: Diagnosis not present

## 2023-01-05 DIAGNOSIS — Z0000 Encounter for general adult medical examination without abnormal findings: Secondary | ICD-10-CM | POA: Diagnosis not present

## 2023-01-05 DIAGNOSIS — Z125 Encounter for screening for malignant neoplasm of prostate: Secondary | ICD-10-CM | POA: Diagnosis not present

## 2023-01-23 DIAGNOSIS — S0531XA Ocular laceration without prolapse or loss of intraocular tissue, right eye, initial encounter: Secondary | ICD-10-CM | POA: Diagnosis not present

## 2023-01-23 DIAGNOSIS — T868481 Other complications of corneal transplant, right eye: Secondary | ICD-10-CM | POA: Diagnosis not present

## 2023-01-23 DIAGNOSIS — Z947 Corneal transplant status: Secondary | ICD-10-CM | POA: Diagnosis not present

## 2023-01-23 DIAGNOSIS — S0521XA Ocular laceration and rupture with prolapse or loss of intraocular tissue, right eye, initial encounter: Secondary | ICD-10-CM | POA: Diagnosis not present

## 2023-01-23 DIAGNOSIS — H53141 Visual discomfort, right eye: Secondary | ICD-10-CM | POA: Diagnosis not present

## 2023-01-23 DIAGNOSIS — T1590XA Foreign body on external eye, part unspecified, unspecified eye, initial encounter: Secondary | ICD-10-CM | POA: Diagnosis not present

## 2023-01-23 DIAGNOSIS — Z961 Presence of intraocular lens: Secondary | ICD-10-CM | POA: Diagnosis not present

## 2023-01-23 DIAGNOSIS — H5711 Ocular pain, right eye: Secondary | ICD-10-CM | POA: Diagnosis not present

## 2023-01-23 DIAGNOSIS — W541XXA Struck by dog, initial encounter: Secondary | ICD-10-CM | POA: Diagnosis not present

## 2023-01-24 DIAGNOSIS — H02403 Unspecified ptosis of bilateral eyelids: Secondary | ICD-10-CM | POA: Diagnosis not present

## 2023-01-24 DIAGNOSIS — Z947 Corneal transplant status: Secondary | ICD-10-CM | POA: Diagnosis not present

## 2023-01-24 DIAGNOSIS — Z9889 Other specified postprocedural states: Secondary | ICD-10-CM | POA: Diagnosis not present

## 2023-01-24 DIAGNOSIS — Z961 Presence of intraocular lens: Secondary | ICD-10-CM | POA: Diagnosis not present

## 2023-01-30 DIAGNOSIS — Z9889 Other specified postprocedural states: Secondary | ICD-10-CM | POA: Diagnosis not present

## 2023-01-30 DIAGNOSIS — Z947 Corneal transplant status: Secondary | ICD-10-CM | POA: Diagnosis not present

## 2023-01-30 DIAGNOSIS — Z48810 Encounter for surgical aftercare following surgery on the sense organs: Secondary | ICD-10-CM | POA: Diagnosis not present

## 2023-02-03 DIAGNOSIS — S0531XD Ocular laceration without prolapse or loss of intraocular tissue, right eye, subsequent encounter: Secondary | ICD-10-CM | POA: Diagnosis not present

## 2023-02-03 DIAGNOSIS — Z9889 Other specified postprocedural states: Secondary | ICD-10-CM | POA: Diagnosis not present

## 2023-02-03 DIAGNOSIS — Z947 Corneal transplant status: Secondary | ICD-10-CM | POA: Diagnosis not present

## 2023-02-22 DIAGNOSIS — Z947 Corneal transplant status: Secondary | ICD-10-CM | POA: Diagnosis not present

## 2023-02-22 DIAGNOSIS — Z9889 Other specified postprocedural states: Secondary | ICD-10-CM | POA: Diagnosis not present

## 2023-02-22 DIAGNOSIS — Z48810 Encounter for surgical aftercare following surgery on the sense organs: Secondary | ICD-10-CM | POA: Diagnosis not present

## 2023-02-22 DIAGNOSIS — Z961 Presence of intraocular lens: Secondary | ICD-10-CM | POA: Diagnosis not present

## 2023-03-15 DIAGNOSIS — Z961 Presence of intraocular lens: Secondary | ICD-10-CM | POA: Diagnosis not present

## 2023-03-15 DIAGNOSIS — Z9889 Other specified postprocedural states: Secondary | ICD-10-CM | POA: Diagnosis not present

## 2023-03-15 DIAGNOSIS — H02403 Unspecified ptosis of bilateral eyelids: Secondary | ICD-10-CM | POA: Diagnosis not present

## 2023-03-15 DIAGNOSIS — Z947 Corneal transplant status: Secondary | ICD-10-CM | POA: Diagnosis not present

## 2023-03-19 IMAGING — CT CT CARDIAC CORONARY ARTERY CALCIUM SCORE
3 series · 14 of 20 positions shown, 16 images · non-contrast
Comparison: None.
COMPARISON: None.

Addendum:
EXAM:
OVER-READ INTERPRETATION  CT CHEST

The following report is an over-read performed by radiologist Dr.
Kizic Tudorovic [REDACTED] on 06/30/2020. This
over-read does not include interpretation of cardiac or coronary
anatomy or pathology. The coronary calcium score interpretation by
the cardiologist is attached.
CLINICAL DATA: Cardiovascular Disease Risk stratification
Coronary Calcium Score
TECHNIQUE: A gated, non-contrast computed tomography scan of the heart was
performed using 3mm slice thickness. Axial images were analyzed on a
dedicated workstation. Calcium scoring of the coronary arteries was
performed using the Agatston method.

[Series 2: casc 3.0 best diast 71 % (id) · axial · 0.35mm/px · z∈[+1241,+1313]mm · 4 of 42 slices shown]
[im 9/42  vessel]
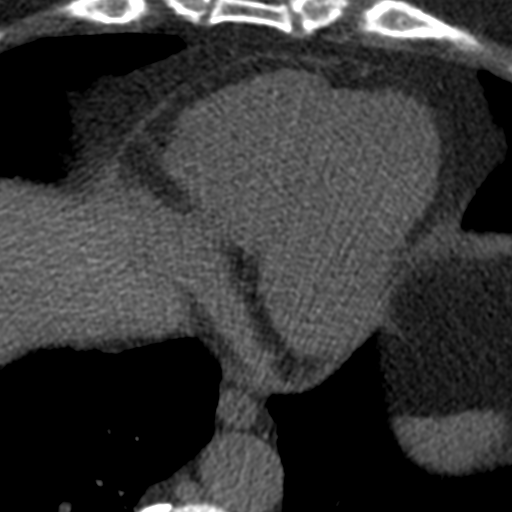
[im 17/42  vessel]
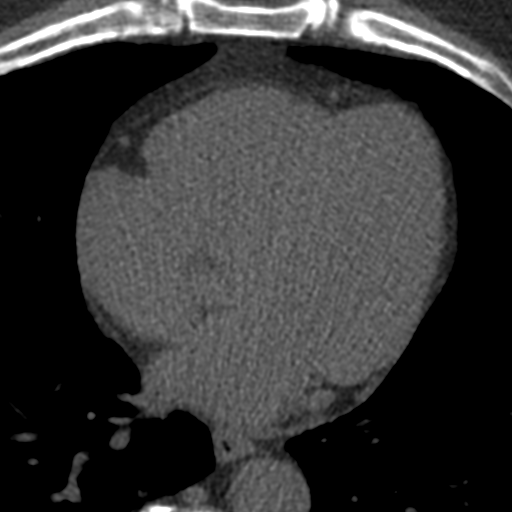
[im 25/42  vessel]
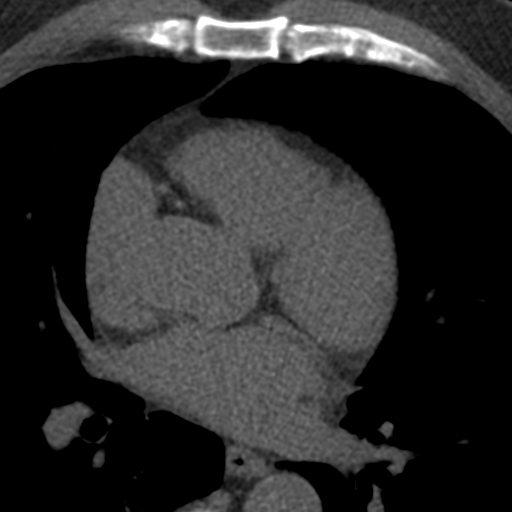
[im 33/42  vessel]
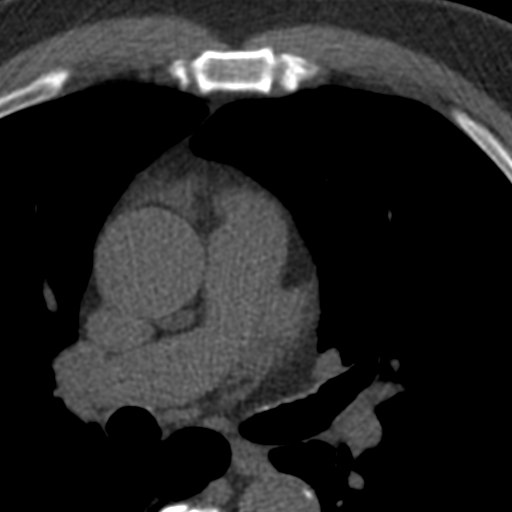

[Series 3: soft full fov 70 % · axial · 0.67mm/px · z∈[+1235,+1319]mm · 5 of 42 slices shown, 7 images]
[im 7/42  vessel]
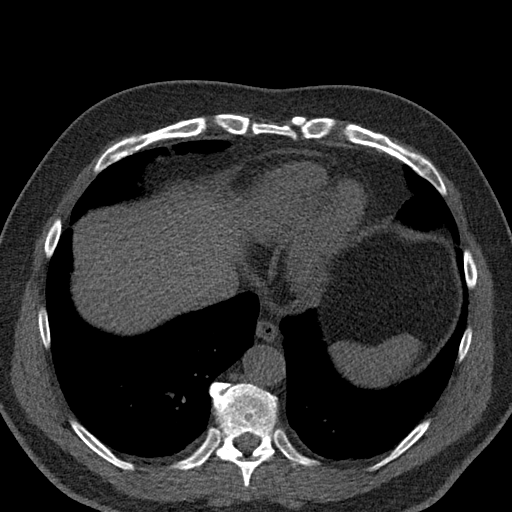
[im 7/42  lung]
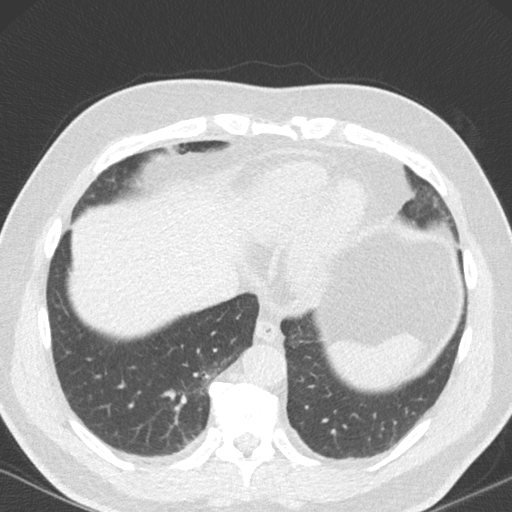
[im 14/42  vessel]
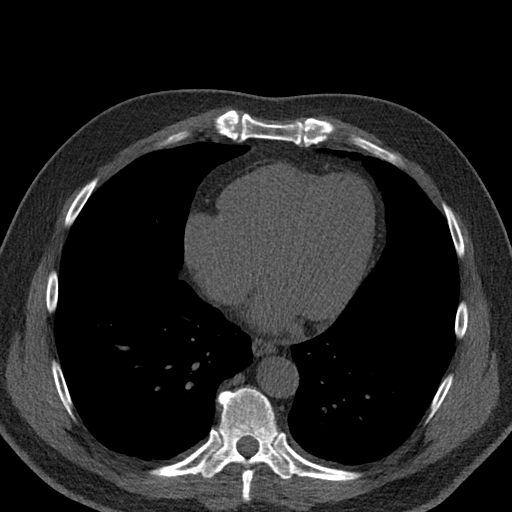
[im 21/42  vessel]
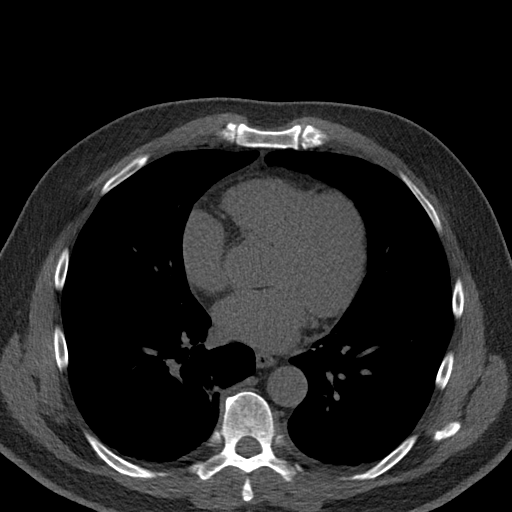
[im 28/42  vessel]
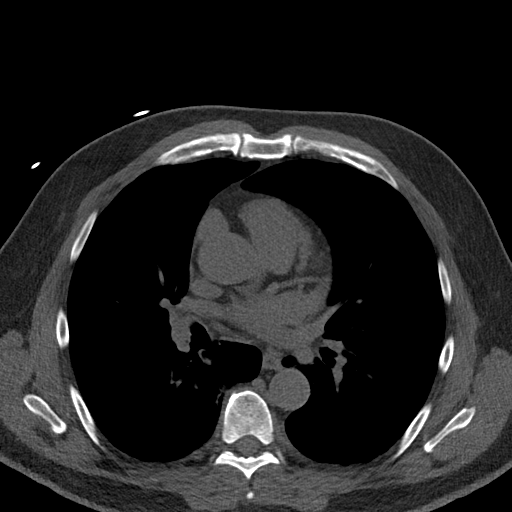
[im 35/42  vessel]
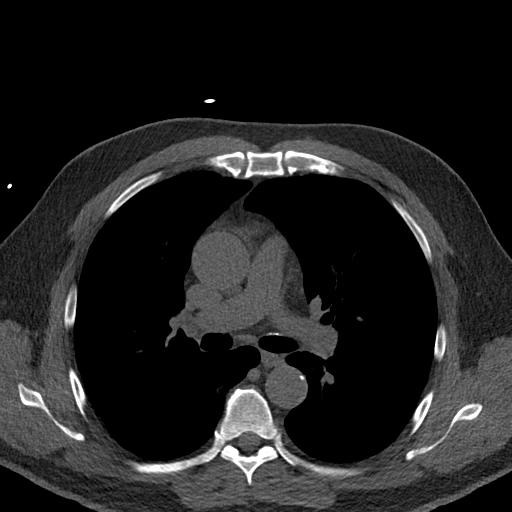
[im 35/42  lung]
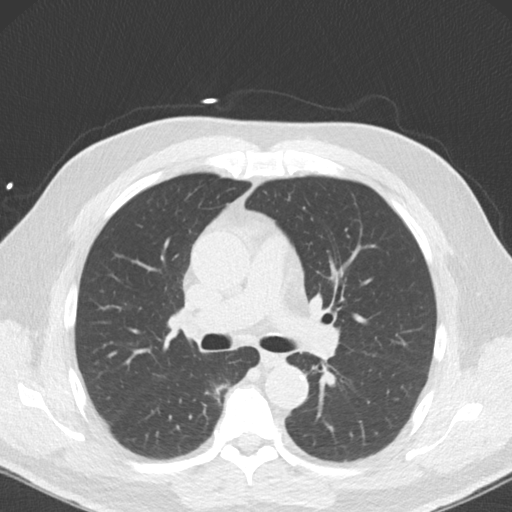

[Series 4: lungs 70 % · axial · 0.67mm/px · z∈[+1235,+1319]mm · 5 of 42 slices shown]
[im 7/42  vessel]
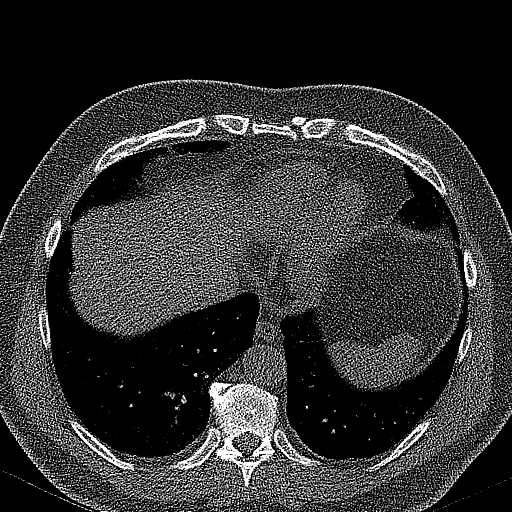
[im 14/42  vessel]
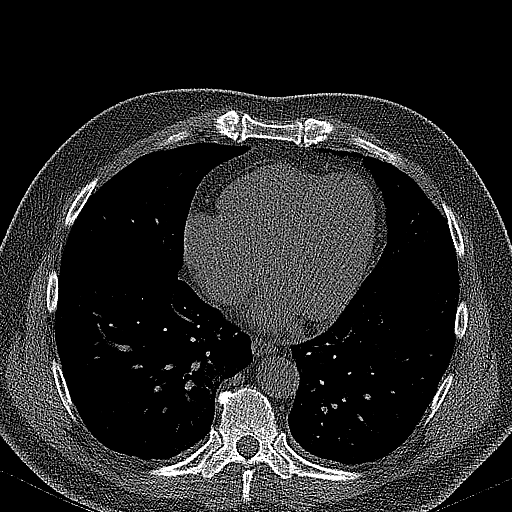
[im 21/42  vessel]
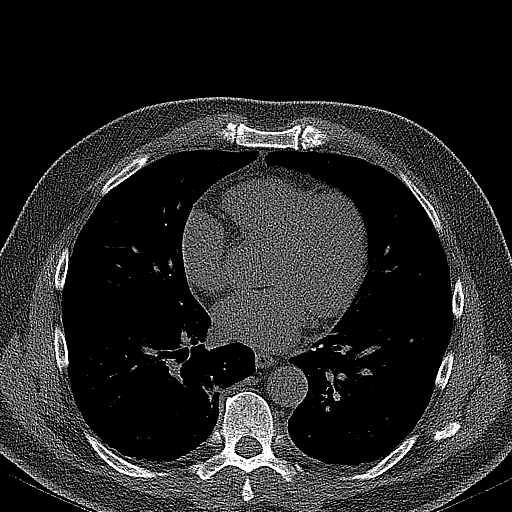
[im 28/42  vessel]
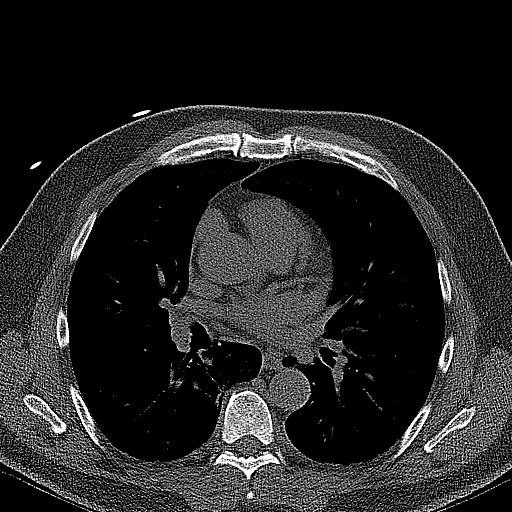
[im 35/42  vessel]
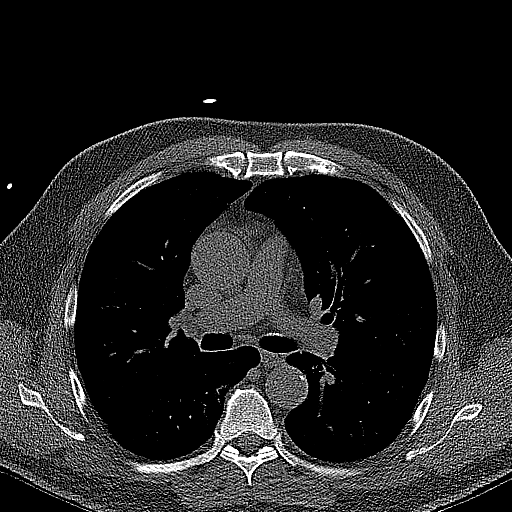

[14 of 20 positions shown; findings below may reference images not displayed]

FINDINGS: Vascular: No significant noncardiac vascular findings.

Mediastinum/Nodes: Visualized mediastinum and hilar regions
demonstrate no lymphadenopathy or masses.

Lungs/Pleura: Visualized lungs show no evidence of pulmonary edema,
consolidation, pneumothorax, nodule or pleural fluid.

Upper Abdomen: No acute abnormality.

Musculoskeletal: No chest wall mass or suspicious bone lesions
identified.
IMPRESSION: No significant incidental findings.
FINDINGS: Coronary Calcium Score:

Left main: 0

Left anterior descending artery:

Left circumflex artery: 0

Right coronary artery: 0

Total:

Percentile:

Pericardium: Normal.

Ascending Aorta: Normal caliber; mild aortic atherosclerosis.

Non-cardiac: See separate report from [REDACTED].
IMPRESSION: Coronary calcium score of 0.483. This was 29 percentile for age-,
race-, and sex-matched controls.

Mild aortic atherosclerosis



If CAC=0, it is reasonable to withhold statin therapy and reassess
in 5 to 10 years, as long as higher risk conditions are absent
(diabetes mellitus, family history of premature CHD in first degree
relatives (males <55 years; females <65 years), cigarette smoking,
or LDL >=190 mg/dL).

If CAC is 1 to 99, it is reasonable to initiate statin therapy for
patients >=55 years of age.

If CAC is >=100 or >=75th percentile, it is reasonable to initiate
statin therapy at any age.

Cardiology referral should be considered for patients with CAC
scores >=400 or >=75th percentile.

*6185 AHA/ACC/AACVPR/AAPA/ABC/GATICA/MARC/SESAN/[place]/KEAN/NUNO ALBERTO/CARRASQUILLO
Guideline on the Management of Blood Cholesterol: A Report of the
American College of Cardiology/American Heart Association Task Force
on Clinical Practice Guidelines. J Am Coll Cardiol.
5178;73(24):7502-7960.

*** End of Addendum ***
EXAM:
OVER-READ INTERPRETATION  CT CHEST

The following report is an over-read performed by radiologist Dr.
Kizic Tudorovic [REDACTED] on 06/30/2020. This
over-read does not include interpretation of cardiac or coronary
anatomy or pathology. The coronary calcium score interpretation by
the cardiologist is attached.
FINDINGS: Vascular: No significant noncardiac vascular findings.

Mediastinum/Nodes: Visualized mediastinum and hilar regions
demonstrate no lymphadenopathy or masses.

Lungs/Pleura: Visualized lungs show no evidence of pulmonary edema,
consolidation, pneumothorax, nodule or pleural fluid.

Upper Abdomen: No acute abnormality.

Musculoskeletal: No chest wall mass or suspicious bone lesions
identified.
IMPRESSION: No significant incidental findings.

## 2023-05-15 DIAGNOSIS — H43391 Other vitreous opacities, right eye: Secondary | ICD-10-CM | POA: Diagnosis not present

## 2023-05-15 DIAGNOSIS — H2701 Aphakia, right eye: Secondary | ICD-10-CM | POA: Diagnosis not present

## 2023-05-15 DIAGNOSIS — H02834 Dermatochalasis of left upper eyelid: Secondary | ICD-10-CM | POA: Diagnosis not present

## 2023-05-15 DIAGNOSIS — H02403 Unspecified ptosis of bilateral eyelids: Secondary | ICD-10-CM | POA: Diagnosis not present

## 2023-05-15 DIAGNOSIS — Z961 Presence of intraocular lens: Secondary | ICD-10-CM | POA: Diagnosis not present

## 2023-05-15 DIAGNOSIS — Z947 Corneal transplant status: Secondary | ICD-10-CM | POA: Diagnosis not present

## 2023-05-15 DIAGNOSIS — H02831 Dermatochalasis of right upper eyelid: Secondary | ICD-10-CM | POA: Diagnosis not present

## 2023-05-15 DIAGNOSIS — Z9889 Other specified postprocedural states: Secondary | ICD-10-CM | POA: Diagnosis not present

## 2023-06-12 DIAGNOSIS — Z23 Encounter for immunization: Secondary | ICD-10-CM | POA: Diagnosis not present

## 2023-06-12 DIAGNOSIS — F411 Generalized anxiety disorder: Secondary | ICD-10-CM | POA: Diagnosis not present

## 2023-06-12 DIAGNOSIS — R972 Elevated prostate specific antigen [PSA]: Secondary | ICD-10-CM | POA: Diagnosis not present

## 2023-07-05 DIAGNOSIS — Z9889 Other specified postprocedural states: Secondary | ICD-10-CM | POA: Diagnosis not present

## 2023-07-05 DIAGNOSIS — Z947 Corneal transplant status: Secondary | ICD-10-CM | POA: Diagnosis not present

## 2023-07-05 DIAGNOSIS — H02403 Unspecified ptosis of bilateral eyelids: Secondary | ICD-10-CM | POA: Diagnosis not present

## 2023-07-05 DIAGNOSIS — H2701 Aphakia, right eye: Secondary | ICD-10-CM | POA: Diagnosis not present

## 2023-10-02 DIAGNOSIS — H02834 Dermatochalasis of left upper eyelid: Secondary | ICD-10-CM | POA: Diagnosis not present

## 2023-10-02 DIAGNOSIS — Z961 Presence of intraocular lens: Secondary | ICD-10-CM | POA: Diagnosis not present

## 2023-10-02 DIAGNOSIS — Z947 Corneal transplant status: Secondary | ICD-10-CM | POA: Diagnosis not present

## 2023-10-02 DIAGNOSIS — H02403 Unspecified ptosis of bilateral eyelids: Secondary | ICD-10-CM | POA: Diagnosis not present

## 2023-10-02 DIAGNOSIS — H02831 Dermatochalasis of right upper eyelid: Secondary | ICD-10-CM | POA: Diagnosis not present

## 2023-10-02 DIAGNOSIS — H2701 Aphakia, right eye: Secondary | ICD-10-CM | POA: Diagnosis not present

## 2023-10-02 DIAGNOSIS — Z9889 Other specified postprocedural states: Secondary | ICD-10-CM | POA: Diagnosis not present

## 2023-10-12 DIAGNOSIS — H2701 Aphakia, right eye: Secondary | ICD-10-CM | POA: Diagnosis not present

## 2023-10-12 DIAGNOSIS — H1789 Other corneal scars and opacities: Secondary | ICD-10-CM | POA: Diagnosis not present

## 2023-10-12 DIAGNOSIS — H179 Unspecified corneal scar and opacity: Secondary | ICD-10-CM | POA: Diagnosis not present

## 2023-10-12 DIAGNOSIS — H538 Other visual disturbances: Secondary | ICD-10-CM | POA: Diagnosis not present

## 2023-10-12 DIAGNOSIS — H1711 Central corneal opacity, right eye: Secondary | ICD-10-CM | POA: Diagnosis not present

## 2023-10-12 DIAGNOSIS — T868411 Corneal transplant failure, right eye: Secondary | ICD-10-CM | POA: Diagnosis not present

## 2023-10-12 DIAGNOSIS — H169 Unspecified keratitis: Secondary | ICD-10-CM | POA: Diagnosis not present

## 2023-10-13 DIAGNOSIS — H02403 Unspecified ptosis of bilateral eyelids: Secondary | ICD-10-CM | POA: Diagnosis not present

## 2023-10-13 DIAGNOSIS — Z947 Corneal transplant status: Secondary | ICD-10-CM | POA: Diagnosis not present

## 2023-10-13 DIAGNOSIS — Z961 Presence of intraocular lens: Secondary | ICD-10-CM | POA: Diagnosis not present

## 2023-10-13 DIAGNOSIS — Z9889 Other specified postprocedural states: Secondary | ICD-10-CM | POA: Diagnosis not present

## 2023-10-18 DIAGNOSIS — Z961 Presence of intraocular lens: Secondary | ICD-10-CM | POA: Diagnosis not present

## 2023-10-18 DIAGNOSIS — Z48810 Encounter for surgical aftercare following surgery on the sense organs: Secondary | ICD-10-CM | POA: Diagnosis not present

## 2023-10-18 DIAGNOSIS — Z947 Corneal transplant status: Secondary | ICD-10-CM | POA: Diagnosis not present

## 2023-10-18 DIAGNOSIS — Z9889 Other specified postprocedural states: Secondary | ICD-10-CM | POA: Diagnosis not present

## 2023-10-25 DIAGNOSIS — Z961 Presence of intraocular lens: Secondary | ICD-10-CM | POA: Diagnosis not present

## 2023-10-25 DIAGNOSIS — Z9889 Other specified postprocedural states: Secondary | ICD-10-CM | POA: Diagnosis not present

## 2023-10-25 DIAGNOSIS — Z947 Corneal transplant status: Secondary | ICD-10-CM | POA: Diagnosis not present

## 2023-10-25 DIAGNOSIS — H02403 Unspecified ptosis of bilateral eyelids: Secondary | ICD-10-CM | POA: Diagnosis not present

## 2023-11-07 DIAGNOSIS — H02403 Unspecified ptosis of bilateral eyelids: Secondary | ICD-10-CM | POA: Diagnosis not present

## 2023-11-07 DIAGNOSIS — Z9889 Other specified postprocedural states: Secondary | ICD-10-CM | POA: Diagnosis not present

## 2023-11-07 DIAGNOSIS — Z947 Corneal transplant status: Secondary | ICD-10-CM | POA: Diagnosis not present

## 2023-11-07 DIAGNOSIS — Z961 Presence of intraocular lens: Secondary | ICD-10-CM | POA: Diagnosis not present

## 2023-12-08 DIAGNOSIS — Z9889 Other specified postprocedural states: Secondary | ICD-10-CM | POA: Diagnosis not present

## 2023-12-08 DIAGNOSIS — Z947 Corneal transplant status: Secondary | ICD-10-CM | POA: Diagnosis not present

## 2023-12-08 DIAGNOSIS — Z961 Presence of intraocular lens: Secondary | ICD-10-CM | POA: Diagnosis not present

## 2023-12-08 DIAGNOSIS — H02403 Unspecified ptosis of bilateral eyelids: Secondary | ICD-10-CM | POA: Diagnosis not present
